# Patient Record
Sex: Male | Born: 1985 | Race: White | Hispanic: No | Smoking: Current every day smoker
Health system: Southern US, Community
[De-identification: ages and names within clinical notes are randomized; demographics above are authoritative.]

## PROBLEM LIST (undated history)

## (undated) DIAGNOSIS — F39 Unspecified mood [affective] disorder: Secondary | ICD-10-CM

## (undated) DIAGNOSIS — Z8679 Personal history of other diseases of the circulatory system: Secondary | ICD-10-CM

## (undated) DIAGNOSIS — F909 Attention-deficit hyperactivity disorder, unspecified type: Secondary | ICD-10-CM

## (undated) DIAGNOSIS — F1011 Alcohol abuse, in remission: Secondary | ICD-10-CM

## (undated) DIAGNOSIS — F419 Anxiety disorder, unspecified: Secondary | ICD-10-CM

## (undated) DIAGNOSIS — K219 Gastro-esophageal reflux disease without esophagitis: Secondary | ICD-10-CM

## (undated) DIAGNOSIS — F32A Depression, unspecified: Secondary | ICD-10-CM

## (undated) DIAGNOSIS — F1121 Opioid dependence, in remission: Secondary | ICD-10-CM

## (undated) DIAGNOSIS — M1990 Unspecified osteoarthritis, unspecified site: Secondary | ICD-10-CM

## (undated) DIAGNOSIS — R011 Cardiac murmur, unspecified: Secondary | ICD-10-CM

## (undated) DIAGNOSIS — F1220 Cannabis dependence, uncomplicated: Secondary | ICD-10-CM

## (undated) DIAGNOSIS — F439 Reaction to severe stress, unspecified: Secondary | ICD-10-CM

## (undated) HISTORY — DX: Attention-deficit hyperactivity disorder, unspecified type: F90.9

## (undated) HISTORY — PX: ADENOIDECTOMY: SUR15

## (undated) HISTORY — DX: Anxiety disorder, unspecified: F41.9

## (undated) HISTORY — PX: TONSILLECTOMY: SUR1361

## (undated) HISTORY — DX: Gastro-esophageal reflux disease without esophagitis: K21.9

## (undated) HISTORY — DX: Depression, unspecified: F32.A

## (undated) HISTORY — PX: HERNIA REPAIR: SHX51

---

## 2008-03-27 ENCOUNTER — Emergency Department: Payer: Self-pay | Admitting: Emergency Medicine

## 2008-05-28 ENCOUNTER — Emergency Department: Payer: Self-pay | Admitting: Emergency Medicine

## 2009-10-10 ENCOUNTER — Emergency Department: Payer: Self-pay | Admitting: Emergency Medicine

## 2010-05-28 ENCOUNTER — Emergency Department: Payer: Self-pay | Admitting: Emergency Medicine

## 2010-05-30 ENCOUNTER — Emergency Department: Payer: Self-pay | Admitting: Emergency Medicine

## 2010-06-03 ENCOUNTER — Emergency Department: Payer: Self-pay | Admitting: Unknown Physician Specialty

## 2010-10-01 ENCOUNTER — Emergency Department: Payer: Self-pay | Admitting: Emergency Medicine

## 2010-11-29 ENCOUNTER — Emergency Department: Payer: Self-pay | Admitting: Emergency Medicine

## 2011-01-04 ENCOUNTER — Emergency Department: Payer: Self-pay | Admitting: Emergency Medicine

## 2011-05-29 ENCOUNTER — Emergency Department: Payer: Self-pay | Admitting: *Deleted

## 2011-07-27 ENCOUNTER — Emergency Department: Payer: Self-pay | Admitting: Emergency Medicine

## 2011-07-27 LAB — URINALYSIS, COMPLETE
Blood: NEGATIVE
Leukocyte Esterase: NEGATIVE
Nitrite: NEGATIVE
Ph: 7 (ref 4.5–8.0)
RBC,UR: NONE SEEN /HPF (ref 0–5)
Specific Gravity: 1.005 (ref 1.003–1.030)
Squamous Epithelial: NONE SEEN
WBC UR: <1 /HPF (ref 0–5)

## 2012-06-12 ENCOUNTER — Emergency Department: Payer: Self-pay | Admitting: Emergency Medicine

## 2012-06-12 LAB — CBC
HGB: 16 g/dL (ref 13.0–18.0)
MCHC: 35.4 g/dL (ref 32.0–36.0)
MCV: 85 fL (ref 80–100)
Platelet: 236 10*3/uL (ref 150–440)
RBC: 5.3 10*6/uL (ref 4.40–5.90)
RDW: 13 % (ref 11.5–14.5)
WBC: 8.3 10*3/uL (ref 3.8–10.6)

## 2012-06-12 LAB — BASIC METABOLIC PANEL
Anion Gap: 6 — ABNORMAL LOW (ref 7–16)
BUN: 16 mg/dL (ref 7–18)
Creatinine: 0.69 mg/dL (ref 0.60–1.30)
Osmolality: 282 (ref 275–301)
Potassium: 3.6 mmol/L (ref 3.5–5.1)

## 2012-06-12 LAB — URINALYSIS, COMPLETE
Bacteria: NONE SEEN
Glucose,UR: NEGATIVE mg/dL (ref 0–75)
Ketone: NEGATIVE
Leukocyte Esterase: NEGATIVE
Nitrite: NEGATIVE
Ph: 5 (ref 4.5–8.0)
Protein: 30
RBC,UR: 1 /HPF (ref 0–5)
Specific Gravity: 1.024 (ref 1.003–1.030)
Squamous Epithelial: 1
WBC UR: 2 /HPF (ref 0–5)

## 2012-07-07 ENCOUNTER — Ambulatory Visit: Payer: Self-pay | Admitting: Orthopedic Surgery

## 2013-02-05 ENCOUNTER — Emergency Department: Payer: Self-pay | Admitting: Emergency Medicine

## 2013-03-03 ENCOUNTER — Emergency Department: Payer: Self-pay | Admitting: Emergency Medicine

## 2013-03-03 LAB — URINALYSIS, COMPLETE
Bilirubin,UR: NEGATIVE
Blood: NEGATIVE
Ketone: NEGATIVE
Leukocyte Esterase: NEGATIVE
Ph: 7 (ref 4.5–8.0)
Protein: NEGATIVE
Specific Gravity: 1.015 (ref 1.003–1.030)
Squamous Epithelial: NONE SEEN
WBC UR: <1 /HPF (ref 0–5)

## 2013-06-30 ENCOUNTER — Emergency Department: Payer: Self-pay | Admitting: Emergency Medicine

## 2013-09-27 ENCOUNTER — Emergency Department: Payer: Self-pay | Admitting: Emergency Medicine

## 2013-11-07 ENCOUNTER — Emergency Department: Payer: Self-pay | Admitting: Emergency Medicine

## 2013-11-10 ENCOUNTER — Emergency Department: Payer: Self-pay | Admitting: Emergency Medicine

## 2014-03-31 ENCOUNTER — Emergency Department: Payer: Self-pay | Admitting: Emergency Medicine
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
## 2014-07-29 ENCOUNTER — Emergency Department
Admission: EM | Admit: 2014-07-29 | Discharge: 2014-07-29 | Disposition: A | Discharge status: Home / Self Care | Payer: Self-pay | Attending: Emergency Medicine | Admitting: Emergency Medicine

## 2014-07-29 DIAGNOSIS — W260XXA Contact with knife, initial encounter: Secondary | ICD-10-CM | POA: Insufficient documentation

## 2014-07-29 DIAGNOSIS — S61317A Laceration without foreign body of left little finger with damage to nail, initial encounter: Secondary | ICD-10-CM | POA: Insufficient documentation

## 2014-07-29 DIAGNOSIS — S61215A Laceration without foreign body of left ring finger without damage to nail, initial encounter: Secondary | ICD-10-CM

## 2014-07-29 DIAGNOSIS — Y998 Other external cause status: Secondary | ICD-10-CM | POA: Insufficient documentation

## 2014-07-29 DIAGNOSIS — S61315A Laceration without foreign body of left ring finger with damage to nail, initial encounter: Secondary | ICD-10-CM | POA: Insufficient documentation

## 2014-07-29 DIAGNOSIS — S61209A Unspecified open wound of unspecified finger without damage to nail, initial encounter: Secondary | ICD-10-CM

## 2014-07-29 DIAGNOSIS — Z23 Encounter for immunization: Secondary | ICD-10-CM | POA: Insufficient documentation

## 2014-07-29 DIAGNOSIS — Y9389 Activity, other specified: Secondary | ICD-10-CM | POA: Insufficient documentation

## 2014-07-29 DIAGNOSIS — Y9289 Other specified places as the place of occurrence of the external cause: Secondary | ICD-10-CM | POA: Insufficient documentation

## 2014-07-29 MED ORDER — TETANUS-DIPHTH-ACELL PERTUSSIS 5-2.5-18.5 LF-MCG/0.5 IM SUSP
0.5000 mL | Freq: Once | INTRAMUSCULAR | Status: AC
  Administered 2014-07-29: 0.5 mL via INTRAMUSCULAR

## 2014-07-29 MED ORDER — TETANUS-DIPHTH-ACELL PERTUSSIS 5-2.5-18.5 LF-MCG/0.5 IM SUSP
INTRAMUSCULAR | Status: AC
  Filled 2014-07-29: qty 0.5

## 2014-07-29 MED ORDER — TETANUS-DIPHTHERIA TOXOIDS TD 5-2 LFU IM INJ
INJECTION | INTRAMUSCULAR | Status: AC
  Filled 2014-07-29: qty 0.5

## 2014-07-29 NOTE — ED Notes (Signed)
Pt was cutting a zip tie with a knife and cut left 4th and 5th fingers.

## 2014-07-29 NOTE — Discharge Instructions (Signed)
Keep clean. Clean daily with soap and water, rinse, pat dry then apply thin layer topical antibiotic ointment daily.   Follow up with your primary care physician or the above next week.   Return to ER for new or worsening concerns.   Fingertip Injuries and Amputations Fingertip injuries are common and often get injured because they are last to escape when pulling your hand out of harm's way. You have amputated (cut off) part of your finger. How this turns out depends largely on how much was amputated. If just the tip is amputated, often the end of the finger will grow back and the finger may return to much the same as it was before the injury.  If more of the finger is missing, your caregiver has done the best with the tissue remaining to allow you to keep as much finger as is possible. Your caregiver after checking your injury has tried to leave you with a painless fingertip that has durable, feeling skin. If possible, your caregiver has tried to maintain the finger's length and appearance and preserve its fingernail.  Please read the instructions outlined below and refer to this sheet in the next few weeks. These instructions provide you with general information on caring for yourself. Your caregiver may also give you specific instructions. While your treatment has been done according to the most current medical practices available, unavoidable complications occasionally occur. If you have any problems or questions after discharge, please call your caregiver. HOME CARE INSTRUCTIONS   You may resume normal diet and activities as directed or allowed.  Keep your hand elevated above the level of your heart. This helps decrease pain and swelling.  Keep ice packs (or a bag of ice wrapped in a towel) on the injured area for 15-20 minutes, 03-04 times per day, for the first two days.  Change dressings if necessary or as directed.  Clean the wound daily or as directed.  Only take over-the-counter or  prescription medicines for pain, discomfort, or fever as directed by your caregiver.  Keep appointments as directed. SEEK IMMEDIATE MEDICAL CARE IF:  You develop redness, swelling, numbness or increasing pain in the wound.  There is pus coming from the wound.  You develop an unexplained oral temperature above 102 F (38.9 C) or as your caregiver suggests.  There is a foul (bad) smell coming from the wound or dressing.  There is a breaking open of the wound (edges not staying together) after sutures or staples have been removed. MAKE SURE YOU:   Understand these instructions.  Will watch your condition.  Will get help right away if you are not doing well or get worse. Document Released: 02/01/2005 Document Revised: 06/05/2011 Document Reviewed: 01/01/2008 [facility]reExit[facility] Patient Information 2015 [place], [place]. This information is not intended to replace advice given to you by your health care provider. Make sure you discuss any questions you have with your health care provider.  Laceration Care, Adult A laceration is a cut that goes through all layers of the skin. The cut goes into the tissue beneath the skin. HOME CARE For stitches (sutures) or staples:  Keep the cut clean and dry.  If you have a bandage (dressing), change it at least once a day. Change the bandage if it gets wet or dirty, or as told by your doctor.  Wash the cut with soap and water 2 times a day. Rinse the cut with water. Pat it dry with a clean towel.  Put a thin layer  of medicated cream on the cut as told by your doctor.  You may shower after the first 24 hours. Do not soak the cut in water until the stitches are removed.  Only take medicines as told by your doctor.  Have your stitches or staples removed as told by your doctor. For skin adhesive strips:  Keep the cut clean and dry.  Do not get the strips wet. You may take a bath, but be careful to keep the cut dry.  If the cut gets wet, pat it dry with  a clean towel.  The strips will fall off on their own. Do not remove the strips that are still stuck to the cut. For wound glue:  You may shower or take baths. Do not soak or scrub the cut. Do not swim. Avoid heavy sweating until the glue falls off on its own. After a shower or bath, pat the cut dry with a clean towel.  Do not put medicine on your cut until the glue falls off.  If you have a bandage, do not put tape over the glue.  Avoid lots of sunlight or tanning lamps until the glue falls off. Put sunscreen on the cut for the first year to reduce your scar.  The glue will fall off on its own. Do not pick at the glue. You may need a tetanus shot if:  You cannot remember when you had your last tetanus shot.  You have never had a tetanus shot. If you need a tetanus shot and you choose not to have one, you may get tetanus. Sickness from tetanus can be serious. GET HELP RIGHT AWAY IF:   Your pain does not get better with medicine.  Your arm, hand, leg, or foot loses feeling (numbness) or changes color.  Your cut is bleeding.  Your joint feels weak, or you cannot use your joint.  You have painful lumps on your body.  Your cut is red, puffy (swollen), or painful.  You have a red line on the skin near the cut.  You have yellowish-white fluid (pus) coming from the cut.  You have a fever.  You have a bad smell coming from the cut or bandage.  Your cut breaks open before or after stitches are removed.  You notice something coming out of the cut, such as wood or glass.  You cannot move a finger or toe. MAKE SURE YOU:   Understand these instructions.  Will watch your condition.  Will get help right away if you are not doing well or get worse. Document Released: 08/30/2007 Document Revised: 06/05/2011 Document Reviewed: 09/06/2010 [facility] CenterExit[facility] Patient Information 2015 [place], [place]. This information is not intended to replace advice given to you by your health care provider.  Make sure you discuss any questions you have with your health care provider.

## 2014-07-29 NOTE — ED Provider Notes (Signed)
[facility] Emergency Department Provider Note    ____________________________________________  Time seen: ----------------------------------------- 10:00 PM on 07/29/2014 -----------------------------------------   I have reviewed the triage vital signs and the nursing notes.   HISTORY  Chief Complaint Laceration    HPI Shawn Murray is a 29 y.o. male presents to the ER with mother due to laceration to the left fourth and fifth fingers. Reports he was trying to cut a zip tie off of a lawnmower with a knife and knife slipped and cut fourth and fifth fingers. Denies other injury, fall or crush injury. States minimal pain. Denies pain with range of motion. Describes pain as mildly tender.   No past medical history on file.  There are no active problems to display for this patient.   No past surgical history on file.  No current outpatient prescriptions on file.  Allergies Sulfa antibiotics and Aspirin  No family history on file.  Social History History  Substance Use Topics  . Smoking status: Not on file  . Smokeless tobacco: Not on file  . Alcohol Use: Not on file    Review of Systems  Constitutional: Negative for fever. Eyes: Negative for visual changes. ENT: Negative for sore throat. Cardiovascular: Negative for chest pain. Respiratory: Negative for shortness of breath. Gastrointestinal: Negative for abdominal pain, vomiting and diarrhea. Genitourinary: Negative for dysuria. Musculoskeletal: Negative for back pain. Skin: left 4/5 finger laceration as above. [facility]  Neurological: Negative for headaches, focal weakness or numbness.   10-point ROS otherwise negative.  ____________________________________________   PHYSICAL EXAM:  VITAL SIGNS: ED Triage Vitals  Enc Vitals Group     BP 07/29/14 1954 129/74 mmHg     Pulse Rate 07/29/14 1954 84     Resp -- 18     Temp 07/29/14 1954 98.2 F  (36.8 C)     Temp src --      SpO2 07/29/14 1954 98 %     Weight 07/29/14 1954 160 lb (72.576 kg)     Height 07/29/14 1954 6' (1.829 m)     Head Cir --      Peak Flow --      Pain Score 07/29/14 1955 7     Pain Loc --      Pain Edu? --      Excl. in GC? --      Constitutional: Alert and oriented. Well appearing and in no distress. ENT   Head: Normocephalic and atraumatic.   Nose: No congestion/rhinnorhea. Cardiovascular: Normal rate, regular rhythm. Normal and symmetric distal pulses are present in all extremities. No murmurs, rubs, or gallops. Respiratory: Normal respiratory effort without tachypnea nor retractions. Breath sounds are clear and equal bilaterally. No wheezes/rales/rhonchi. Gastrointestinal: Soft and nontender. No distention.  Genitourinary: deferred Musculoskeletal: Nontender with normal range of motion in all extremities. No joint effusions.  No lower extremity tenderness nor edema. Neurologic:  Normal speech and language. No gross focal neurologic deficits are appreciated. Speech is normal. No gait instability. Skin:   Left fourth finger distal phalanx with superficial laceration <0.5cm. Full ROM. Neuro/vasc intact. No erythema. No active bleeding. No pain with ROM.   Left fifth distal finger tip with <0.5 cm superficial laceration. Full ROM. Neuro/vasc intact. No erythema. No active bleeding. No pain with ROM.  Psychiatric: Mood and affect are normal. Speech and behavior are normal. Patient exhibits appropriate insight and judgment.  ________________________________________   PROCEDURES  LACERATION REPAIR Performed by: Renford DillsLindsey Mikeala Girdler  Authorized by: Renford DillsLindsey Elzena Muston Consent: Verbal consent obtained. Risks and benefits: risks, benefits and alternatives were discussed Consent given by: patient Patient identity confirmed: provided demographic data Prepped and Draped in normal sterile fashion Wound explored  Laceration Location: left 4th  finger  Laceration Length: <0.5cm  No Foreign Bodies seen or palpated  Anesthesia:none  Irrigation method: syringe Amount of cleaning: standard. Cleaned with saline and betadine.  Dressing applied.  Patient tolerance: Patient tolerated the procedure well with no immediate complications. LACERATION REPAIR Performed by: Renford DillsLindsey Bentley Fissel Authorized by: Renford DillsLindsey Trey Gulbranson Consent: Verbal consent obtained. Risks and benefits: risks, benefits and alternatives were discussed Consent given by: patient Patient identity confirmed: provided demographic data Prepped and Draped in normal sterile fashion Wound explored  Laceration Location: left 5th finger  Laceration Length: <0.5cm  No Foreign Bodies seen or palpated  Anesthesia:none  Irrigation method: syringe Amount of cleaning: standard. Cleaned with saline and betadine.  Distal finger tip avulsion. Wound edge revised.   No repair indicated.   Sterile dressing applied.  Patient tolerance: Patient tolerated the procedure well with no immediate complications. ____________________________________________   INITIAL IMPRESSION / ASSESSMENT AND PLAN / ED COURSE  Pertinent labs & imaging results that were available during my care of the patient were reviewed by me and considered in my medical decision making (see chart for details).  Well appearing. Presents post obtaining superficial lacerations to left 4 and 5 fingers. Wounds clean. No repair indicated. Fifth finger with superficial distal finger tip avulsion. Discussed cleaning and wound care. Follow up with PCP. Discussed return parameters.  ____________________________________________   FINAL CLINICAL IMPRESSION(S) / ED DIAGNOSES  Final diagnoses:  Laceration of fourth finger of left hand, initial encounter  Avulsion, finger tip, initial encounter to left fifth finger    Renford DillsLindsey Shenita Trego, NP 07/29/14 [id number]  Phineas SemenGraydon Goodman, MD 07/29/14 [phone]

## 2014-08-19 IMAGING — CR DG ANKLE COMPLETE 3+V*L*
1 series · 3 of 3 positions shown · non-contrast
Comparison: None.

CLINICAL DATA: 28-year-old male status post fall with pain and
swelling. Initial encounter.

EXAM:
LEFT ANKLE COMPLETE - 3+ VIEW

[Series 1: ap · 0.17mm/px · 3 of 3 slices shown]
[im 1/3]
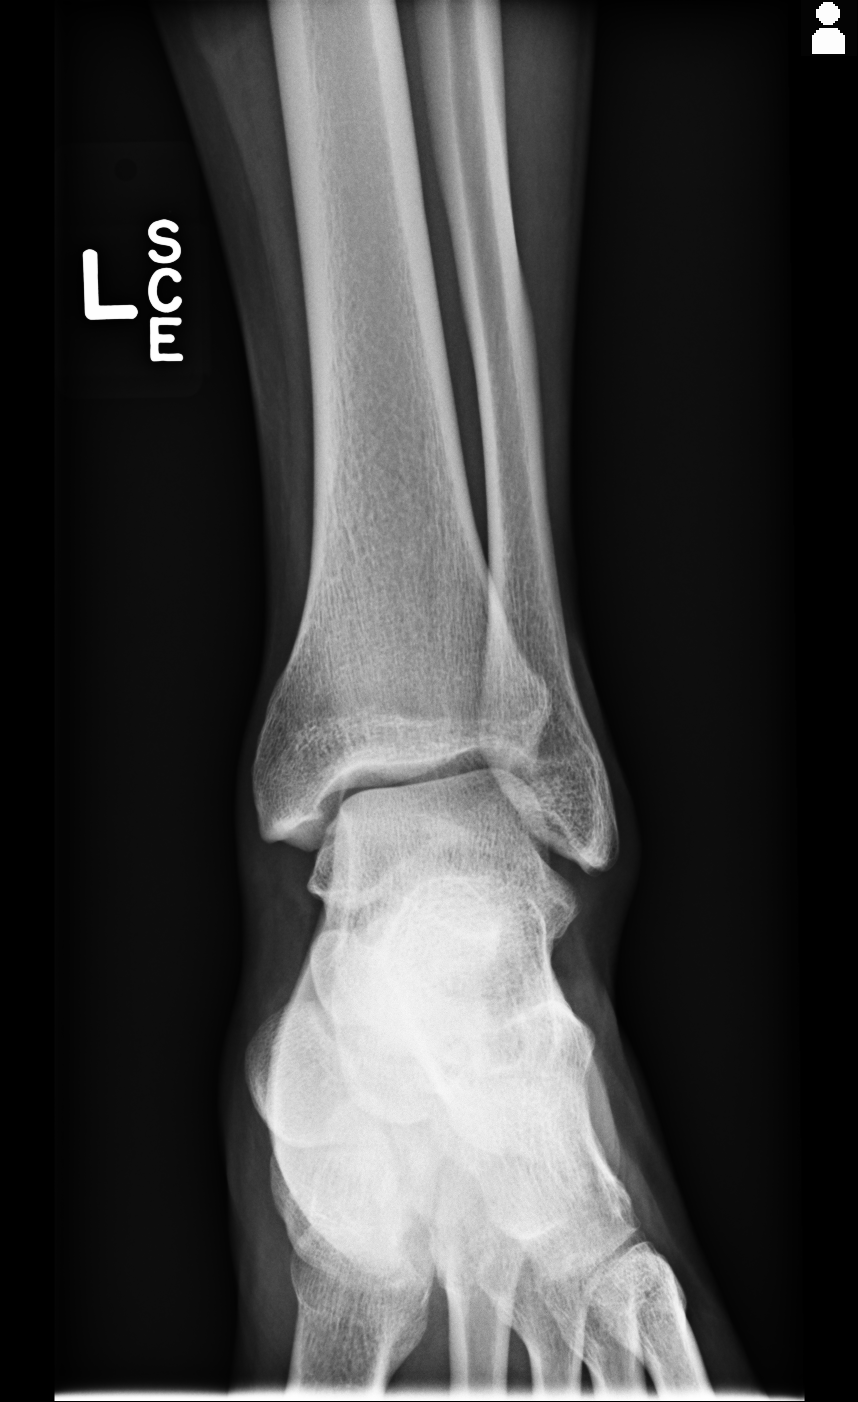
[im 2/3]
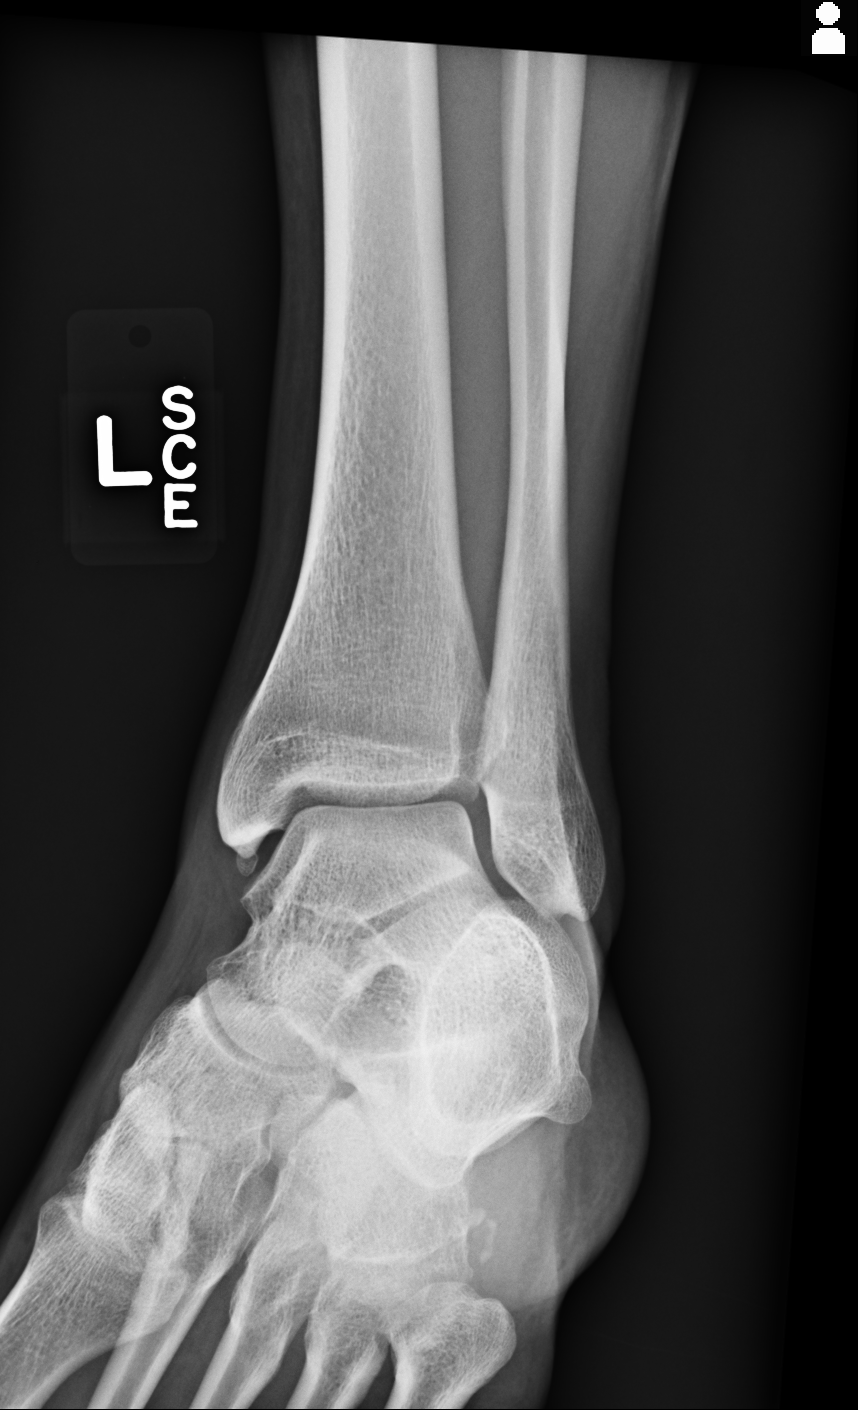
[im 3/3]
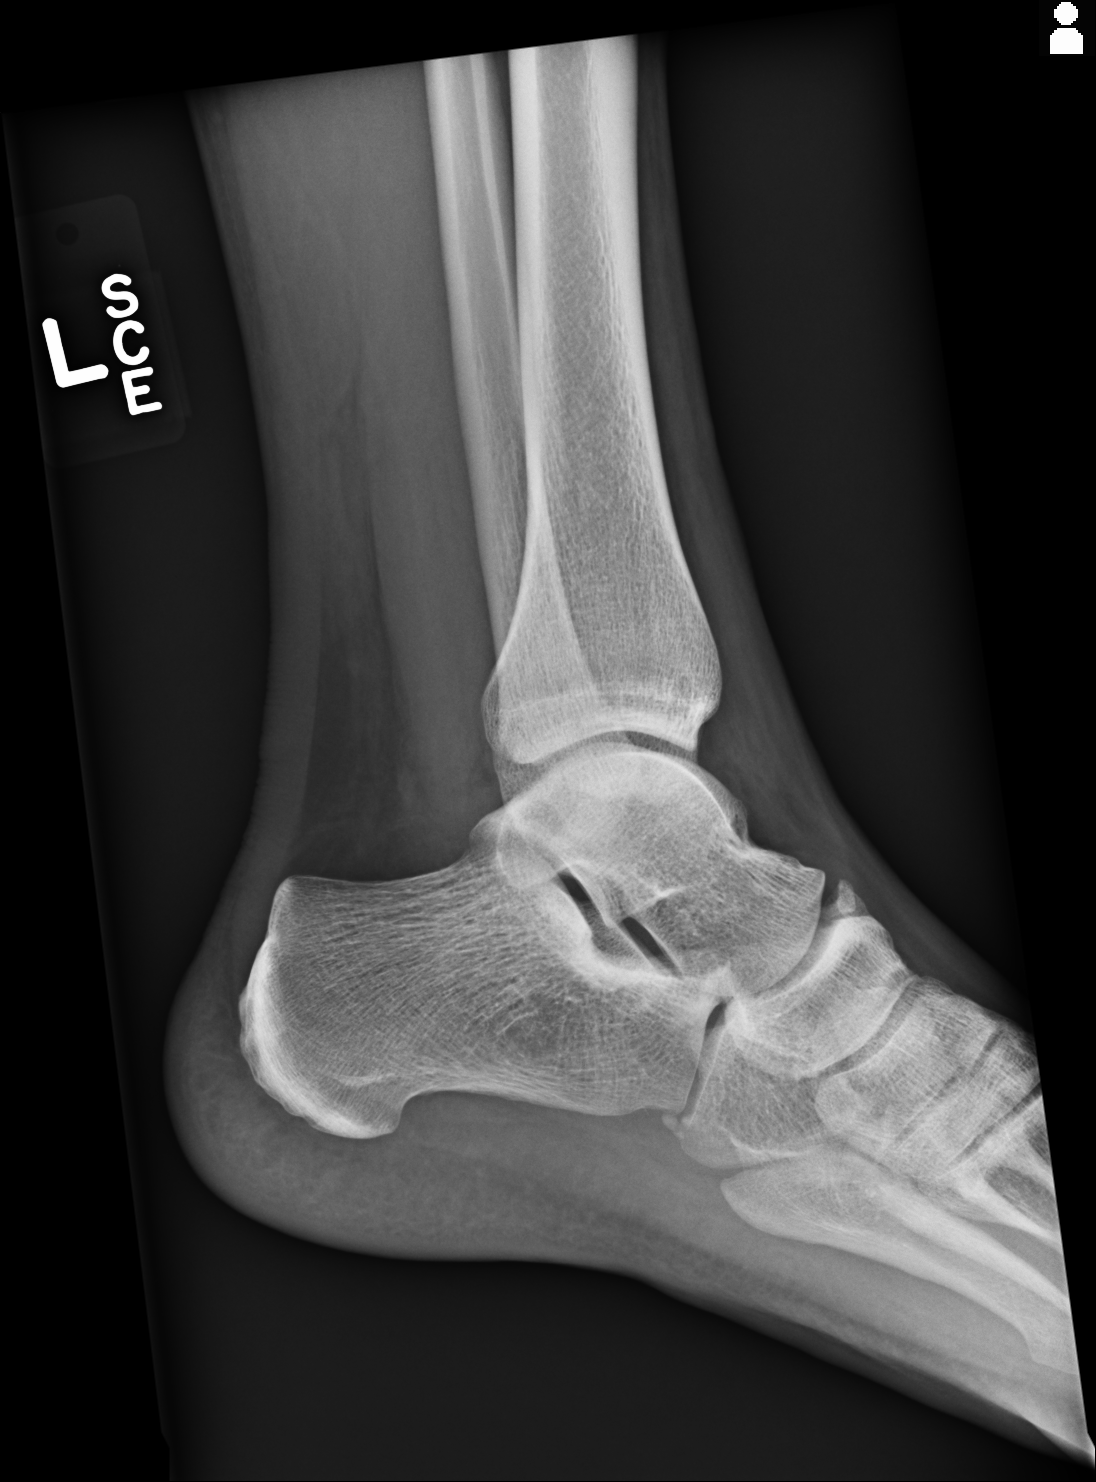

[3 of 3 positions shown; findings below may reference images not displayed]

FINDINGS: Anterior soft tissue stranding at the ankle with no definite joint
effusion identified. Mortise joint alignment preserved. Talar dome
intact. Calcaneus intact. Small chronic appearing bone fragment
adjacent to the medial malleolus. Also small chronic appearing
fragments seen adjacent to the cuboid. No acute fracture or
dislocation identified.
IMPRESSION: No acute fracture or dislocation identified about the left ankle.

## 2015-10-04 ENCOUNTER — Encounter: Payer: Self-pay | Admitting: *Deleted

## 2015-10-04 ENCOUNTER — Emergency Department
Admission: EM | Admit: 2015-10-04 | Discharge: 2015-10-04 | Disposition: A | Discharge status: Home / Self Care | Payer: Self-pay | Attending: Emergency Medicine | Admitting: Emergency Medicine

## 2015-10-04 DIAGNOSIS — Z87891 Personal history of nicotine dependence: Secondary | ICD-10-CM | POA: Insufficient documentation

## 2015-10-04 DIAGNOSIS — B349 Viral infection, unspecified: Secondary | ICD-10-CM | POA: Insufficient documentation

## 2015-10-04 HISTORY — DX: Cardiac murmur, unspecified: R01.1

## 2015-10-04 LAB — CBC
HCT: 42.8 % (ref 40.0–52.0)
Hemoglobin: 14.9 g/dL (ref 13.0–18.0)
MCH: 30.2 pg (ref 26.0–34.0)
MCHC: 34.8 g/dL (ref 32.0–36.0)
MCV: 86.8 fL (ref 80.0–100.0)
PLATELETS: 187 10*3/uL (ref 150–440)
RBC: 4.94 MIL/uL (ref 4.40–5.90)
RDW: 12.9 % (ref 11.5–14.5)
WBC: 7.5 10*3/uL (ref 3.8–10.6)

## 2015-10-04 LAB — TROPONIN I: Troponin I: <0.03 ng/mL (ref <0.03)

## 2015-10-04 LAB — COMPREHENSIVE METABOLIC PANEL
ALBUMIN: 4.6 g/dL (ref 3.5–5.0)
ALT: 18 U/L (ref 17–63)
ANION GAP: 5 (ref 5–15)
AST: 7 U/L — ABNORMAL LOW (ref 15–41)
Alkaline Phosphatase: 51 U/L (ref 38–126)
BUN: 8 mg/dL (ref 6–20)
CHLORIDE: 111 mmol/L (ref 101–111)
CO2: 25 mmol/L (ref 22–32)
CREATININE: 0.77 mg/dL (ref 0.61–1.24)
Calcium: 9 mg/dL (ref 8.9–10.3)
GFR calc non Af Amer: >60 mL/min (ref >60)
GLUCOSE: 107 mg/dL — AB (ref 65–99)
Potassium: 3.6 mmol/L (ref 3.5–5.1)
SODIUM: 141 mmol/L (ref 135–145)
Total Bilirubin: 0.5 mg/dL (ref 0.3–1.2)
Total Protein: 6.9 g/dL (ref 6.5–8.1)

## 2015-10-04 MED ORDER — ONDANSETRON HCL 4 MG PO TABS: 4.0000 mg | ORAL_TABLET | Freq: Every day | ORAL | Status: DC | PRN

## 2015-10-04 MED ORDER — ONDANSETRON HCL 4 MG/2ML IJ SOLN
4.0000 mg | Freq: Once | INTRAMUSCULAR | Status: AC
  Administered 2015-10-04: 4 mg via INTRAVENOUS
  Filled 2015-10-04: qty 2

## 2015-10-04 MED ORDER — SODIUM CHLORIDE 0.9 % IV SOLN
1000.0000 mL | Freq: Once | INTRAVENOUS | Status: AC
  Administered 2015-10-04: 1000 mL via INTRAVENOUS

## 2015-10-04 NOTE — ED Notes (Signed)
States his whole body is numb, states cold sweats and he feels dehydrated, states he feels as if he is going to pass out, states nausea, symptoms present since Friday, pt awake and alert in no distress, states he has a black mark on his lip that he noticed after he stopped smoking a year ago, states lip numbness

## 2015-10-04 NOTE — Discharge Instructions (Signed)
Viral Infections °A viral infection can be caused by different types of viruses. Most viral infections are not serious and resolve on their own. However, some infections may cause severe symptoms and may lead to further complications. °SYMPTOMS °Viruses can frequently cause: °· Minor sore throat. °· Aches and pains. °· Headaches. °· Runny nose. °· Different types of rashes. °· Watery eyes. °· Tiredness. °· Cough. °· Loss of appetite. °· Gastrointestinal infections, resulting in nausea, vomiting, and diarrhea. °These symptoms do not respond to antibiotics because the infection is not caused by bacteria. However, you might catch a bacterial infection following the viral infection. This is sometimes called a "superinfection." Symptoms of such a bacterial infection may include: °1. Worsening sore throat with pus and difficulty swallowing. °2. Swollen neck glands. °3. Chills and a high or persistent fever. °4. Severe headache. °5. Tenderness over the sinuses. °6. Persistent overall ill feeling (malaise), muscle aches, and tiredness (fatigue). °7. Persistent cough. °8. Yellow, green, or brown mucus production with coughing. °HOME CARE INSTRUCTIONS  °· Only take over-the-counter or prescription medicines for pain, discomfort, diarrhea, or fever as directed by your caregiver. °· Drink enough water and fluids to keep your urine clear or pale yellow. Sports drinks can provide valuable electrolytes, sugars, and hydration. °· Get plenty of rest and maintain proper nutrition. Soups and broths with crackers or rice are fine. °SEEK IMMEDIATE MEDICAL CARE IF:  °· You have severe headaches, shortness of breath, chest pain, neck pain, or an unusual rash. °· You have uncontrolled vomiting, diarrhea, or you are unable to keep down fluids. °· You or your child has an oral temperature above 102° F (38.9° C), not controlled by medicine. °· Your baby is older than 3 months with a rectal temperature of 102° F (38.9° C) or higher. °· Your  baby is 3 months old or younger with a rectal temperature of 100.4° F (38° C) or higher. °MAKE SURE YOU:  °· Understand these instructions. °· Will watch your condition. °· Will get help right away if you are not doing well or get worse. °  °This information is not intended to replace advice given to you by your health care provider. Make sure you discuss any questions you have with your health care provider. °  °Document Released: 12/21/2004 Document Revised: 06/05/2011 Document Reviewed: 08/19/2014 °Elsevier Interactive Patient Education ©2016 Elsevier Inc. ° °Infection Control in the Home °If you have an infection or you are taking care of someone who has an infection, it is important to know how to keep the infection from spreading. Follow these guidelines to help stop the spread of infection, and talk to your health care provider. °HOW ARE INFECTIONS SPREAD? °In order for an infection to spread, the following must be present: °· A germ. This may be a virus, bacteria, fungus, or parasite. °· A place for the germ to live. This may be: °¨ On or in a person, animal, plant, or food. °¨ In soil or water. °¨ On surfaces, such as a door handle. °· A susceptible host. This is a person or animal who does not have resistance (immunity) to the germ. °· A way for the germ to enter the host. This may occur by: °¨ Direct contact. This may happen by making contact--such as shaking hands or hugging--with an infected person or animal. Some germs can also travel through the air and spread to you if an infected person coughs or sneezes on you or near you. °¨ Indirect contact. This is when   the germ enters the host through contact with an infected object. Examples include eating contaminated food, drinking contaminated water, or touching a contaminated surface with your hands and then touching your face, nose, or mouth soon after that. HOW CAN I HELP TO PREVENT INFECTION FROM SPREADING? There are several things that you can do to  help prevent infection from spreading. Hand Washing It is very important to wash your hands correctly, following these steps: 9. Wet your hands with clean, running water. 10. Apply soap to your hands. Liquid soap is better than bar soap. 11. Rub your hands together quickly to create lather. 12. Keep rubbing your hands together for at least 20 seconds. Thoroughly scrub all parts of your hands, including under your fingernails and between your fingers. 13. Rinse your hands with clean, running water until all of the soap is gone. 14. Dry your hands with an air dryer or a clean paper or cloth towel, or let your hands air-dry. Do not use your clothing or a soiled towel to dry your hands. 15. If you are in a public restroom, use your towel to turn off the water faucet and to open the bathroom door. Make sure to wash your hands:  Before:  Visiting a baby or anyone with a weakened or lowered defense (immune) system.  Putting in and taking out any contact lenses.  After:  Working or playing outside.  Touching an animal or its toys or leash.  Handling livestock.  Using the bathroom or helping a child or adult to use the bathroom.  Using household cleaners or toxic chemicals.  Touching or taking out the garbage.  Touching anything dirty around your home.  Handling soiled clothes or rags.  Taking care of a sick child. This includes touching used tissues, toys, and clothes.  Sneezing, coughing, or blowing your nose.  Using public transportation.  Shaking hands.  Using a phone, including your mobile phone.  Touching money.  Before and after:  Preparing food.  Preparing a bottle for a baby.  Feeding a baby or a young child.  Eating.  Visiting or taking care of someone who is sick.  Changing a diaper.  Changing a bandage (dressing) or taking care of an injury or wound.  Giving or taking medicine. Taking Care of Your Home  Make sure that you have enough cleaning  supplies at all times. These include:  Disinfectants.  Reusable cleaning cloths. Wash these after each use.  Paper towels.  Utility gloves. Replace your gloves if they are cracked or torn or if they start to peel.  Use bleach safely. Never mix it with other cleaning products, especially those that contain ammonia. This mixture can create a dangerous gas that may be deadly.  Take care of your cleaning supplies. Toilet brushes, mops, and sponges can breed germs. Soak them in bleach and water for 5 minutes after each use.  Do not pour used mop water down the sink. Pour it down the toilet instead.  Maintain proper ventilation in your home.  If you have a pet, ensure that your pet stays clean. Do not let people with weak immune systems touch bird droppings, fish tank water, or a litter box.  If you have a cat, be sure to change the litter every day.  In the bathroom, make sure you:  Provide liquid soap.  Change towels and washcloths frequently. Avoid sharing towels and washcloths.  Change toothbrushes often and store them separately in a clean, dry place.  Disinfect  the toilet.  Clean the tub, shower, and sink with standard cleaning products.   Mop the floor with a standard cleaner.  Do not share personal items, such as razors, toothbrushes, drinking glasses, deodorant, combs, brushes, towels, and washcloths.   In the kitchen, make sure you:  Store food carefully.  Refrigerate leftovers promptly in covered containers.  Throw out stale or spoiled food.  Clean the inside of your refrigerator each week.  Keep your refrigerator set at 36F (4C) or less, and set your freezer at 56F (-18C) or less.  Thaw foods in the refrigerator or microwave, not at room temperature.  Serve foods at the proper temperature. Do not eat raw meat. Make sure it is cooked to the appropriate temperature.Cook eggs until they are firm.  Wash fruits and vegetables under running water.  Use  separate cutting boards, plates, and utensils for raw foods and cooked foods.  Keep work surfaces clean.  Use a clean spoon each time you sample food while cooking.  Wash your dishes in hot, soapy water. Air-dry your dishes or use a dishwasher.  Do not share forks, cups, or spoons during meals.  Wear gloves if laundry is visibly soiled.  Change linens each week or whenever they are soiled.  Do not shake soiled linens. Doing that may send germs into the air. Put dressings, sanitary or incontinence pads, diapers, and gloves in plastic garbage bags for disposal.   This information is not intended to replace advice given to you by your health care provider. Make sure you discuss any questions you have with your health care provider.   Document Released: 12/21/2007 Document Revised: 04/03/2014 Document Reviewed: 11/13/2013 Elsevier Interactive Patient Education [employer]2016 Elsevier Inc.

## 2015-10-04 NOTE — ED Provider Notes (Signed)
[facility] Emergency Department Provider Note   ____________________________________________    I have reviewed the triage vital signs and the nursing notes.   HISTORY  Chief Complaint Nausea, congestion, chills    HPI Shawn Murray is a 30 y.o. male who presents with multiple symptoms. He complains of mild nausea, nasal congestion, chills. He reports his girlfriend has upper respiratory infection. He denies abdominal pain. He has had mild diarrhea. He also reports 2 episodes of dizziness over the last 3 days. Currently he feels relatively well. He denies chest pain or palpitations. He also states that his fingers have become numb in both hands once over the last couple of days   Past Medical History  Diagnosis Date  . Heart murmur     There are no active problems to display for this patient.   History reviewed. No pertinent past surgical history.  No current outpatient prescriptions on file.  Allergies Sulfa antibiotics and Aspirin  History reviewed. No pertinent family history.  Social History Social History  Substance Use Topics  . Smoking status: Former Games developer  . Smokeless tobacco: None  . Alcohol Use: None    Review of Systems  Constitutional:Positive chills Eyes: No visual changes. No discharge ENT: No sore throat. Cardiovascular: Denies chest pain. Respiratory: Denies shortness of breath.Mild cough Gastrointestinal: No abdominal pain.    Genitourinary: Negative for dysuria. Musculoskeletal: Negative for back pain. Skin: Negative for rash. Neurological: Negative for headaches   10-point ROS otherwise negative.  ____________________________________________   PHYSICAL EXAM:  VITAL SIGNS: ED Triage Vitals  Enc Vitals Group     BP 10/04/15 1016 137/80 mmHg     Pulse Rate 10/04/15 1016 59     Resp 10/04/15 1016 18     Temp 10/04/15 1016 97.7 F (36.5 C)     Temp Source 10/04/15 1016 Oral     SpO2 10/04/15 1016 97  %     Weight 10/04/15 1016 160 lb (72.576 kg)     Height 10/04/15 1016  (1.753 m)     Head Cir --      Peak Flow --      Pain Score --      Pain Loc --      Pain Edu? --      Excl. in GC? --     Constitutional: Alert and oriented. No acute distress. Pleasant and interactive Eyes: Conjunctivae are normal.  Head: Atraumatic.Normocephalic Nose: Positive congestion Mouth/Throat: Mucous membranes are moist.  Oropharynx non-erythematous. Neck: No stridor. Painless ROM Cardiovascular: Normal rate, regular rhythm. Grossly normal heart sounds.  Good peripheral circulation. Respiratory: Normal respiratory effort.  No retractions. Lungs CTAB. Gastrointestinal: Soft and nontender. No distention.  No CVA tenderness. Genitourinary: deferred Musculoskeletal: No lower extremity tenderness nor edema.  Warm and well perfused Neurologic:  Normal speech and language. No gross focal neurologic deficits are appreciated.  Skin:  Skin is warm, dry and intact. No rash noted. Psychiatric: Mood and affect are normal. Speech and behavior are normal.  ____________________________________________   LABS (all labs ordered are listed, but only abnormal results are displayed)  Labs Reviewed  COMPREHENSIVE METABOLIC PANEL - Abnormal; Notable for the following:    Glucose, Bld 107 (*)    AST 7 (*)    All other components within normal limits  CBC   ____________________________________________  EKG  ED ECG REPORT I, Jene Every, the attending physician, personally viewed and interpreted this ECG.  Date: 10/04/2015 EKG Time: 10:32  AM Rate: 51 Rhythm: Sinus bradycardia QRS Axis: normal Intervals: normal ST/T Wave abnormalities: normal Conduction Disturbances: none Narrative Interpretation: unremarkable  ____________________________________________  RADIOLOGY  None ____________________________________________   PROCEDURES  Procedure(s) performed: No    Critical Care performed:  No ____________________________________________   INITIAL IMPRESSION / ASSESSMENT AND PLAN / ED COURSE  Pertinent labs & imaging results that were available during my care of the patient were reviewed by me and considered in my medical decision making (see chart for details).  Patient well-appearing and in no distress. His exam is benign. Lab work is unremarkable. Vitals are unremarkable. He is well-appearing and nontoxic. I suspect a viral illness causing his symptoms. Recommend supportive care. Return precautions discussed. ____________________________________________   FINAL CLINICAL IMPRESSION(S) / ED DIAGNOSES  Final diagnoses:  Viral illness      NEW MEDICATIONS STARTED DURING THIS VISIT:  New Prescriptions   No medications on file     Note:  This document was prepared using Dragon voice recognition software and may include unintentional dictation errors.    Jene Everyobert Stanly Si, MD 10/04/15 1534

## 2016-05-18 ENCOUNTER — Emergency Department
Admission: EM | Admit: 2016-05-18 | Discharge: 2016-05-18 | Disposition: A | Discharge status: Home / Self Care | Payer: Worker's Compensation | Attending: Emergency Medicine | Admitting: Emergency Medicine

## 2016-05-18 DIAGNOSIS — Z87891 Personal history of nicotine dependence: Secondary | ICD-10-CM | POA: Insufficient documentation

## 2016-05-18 DIAGNOSIS — T6594XA Toxic effect of unspecified substance, undetermined, initial encounter: Secondary | ICD-10-CM | POA: Insufficient documentation

## 2016-05-18 DIAGNOSIS — H5713 Ocular pain, bilateral: Secondary | ICD-10-CM | POA: Diagnosis present

## 2016-05-18 DIAGNOSIS — H10213 Acute toxic conjunctivitis, bilateral: Secondary | ICD-10-CM | POA: Diagnosis not present

## 2016-05-18 MED ORDER — TETRACAINE HCL 0.5 % OP SOLN
OPHTHALMIC | Status: AC
  Filled 2016-05-18: qty 2

## 2016-05-18 MED ORDER — TOBRAMYCIN 0.3 % OP SOLN: 2.0000 [drp] | OPHTHALMIC | 0 refills | Status: DC

## 2016-05-18 MED ORDER — EYE WASH OPHTH SOLN
OPHTHALMIC | Status: AC
  Filled 2016-05-18: qty 118

## 2016-05-18 MED ORDER — FLUORESCEIN SODIUM 1 MG OP STRP
ORAL_STRIP | OPHTHALMIC | Status: AC
  Filled 2016-05-18: qty 1

## 2016-05-18 NOTE — ED Notes (Signed)
Pt states was at work when clear oil got onto his hands, pt then states he proceeded to rub his eyes with the oil on his hands. C/o burning pain to L eye at this time, denies any loss of vision at this time.

## 2016-05-18 NOTE — ED Triage Notes (Signed)
Pt arrives here from his job with reports of getting oil into his eye   6/10 pain to the site reported

## 2016-05-18 NOTE — ED Notes (Signed)
NAD noted at time of D/C. Pt denies questions or concerns. Pt ambulatory to the lobby at this time.  

## 2016-05-18 NOTE — Discharge Instructions (Signed)
Follow-up with [facility] if any continued problems. Begin using eyedrops as directed. 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

## 2016-05-18 NOTE — ED Provider Notes (Signed)
[facility] Emergency Department Provider Note  ____________________________________________   First MD Initiated Contact with Patient 05/18/16 [phone]     (approximate)  I have reviewed the triage vital signs and the nursing notes.   HISTORY  Chief Complaint Eye Injury    HPI Shawn Murray is a 31 y.o. male is here with complaint of bilateral eye pain. Patient states the left eye is burning worse than the right. Patient states that while at work he got some clear oral into his eyes from getting it on his hands. He states that he irrigated his eyes with water almost immediately. This happened approximately 2 hours prior to his arrival in the emergency room. He denies any vision changes other than it is burning. He has not used any other medication to his eyes other than water. Patient voices that he is not happy being in the emergency room but his "job make me come". Currently he rates his pain as a 6/10.   Past Medical History:  Diagnosis Date  . Heart murmur     There are no active problems to display for this patient.   No past surgical history on file.  Prior to Admission medications   Medication Sig Start Date End Date Taking? Authorizing Provider  ondansetron (ZOFRAN) 4 MG tablet Take 1 tablet (4 mg total) by mouth daily as needed for nausea or vomiting. 10/04/15   Jene Every, MD  tobramycin (TOBREX) 0.3 % ophthalmic solution Place 2 drops into both eyes every 4 (four) hours. 05/18/16   Tommi Rumps, PA-C    Allergies Sulfa antibiotics and Aspirin  No family history on file.  Social History Social History  Substance Use Topics  . Smoking status: Former Games developer  . Smokeless tobacco: Never Used  . Alcohol use Not on file    Review of Systems Constitutional: No fever/chills Eyes: No visual changes.Bilateral eye burning. ENT: No sore throat. Cardiovascular: Denies chest pain. Respiratory: Denies shortness of  breath. Gastrointestinal:   No nausea, no vomiting.   Skin: Negative for rash. Neurological: Negative for headaches  10-point ROS otherwise negative.  ____________________________________________   PHYSICAL EXAM:  VITAL SIGNS: ED Triage Vitals  Enc Vitals Group     BP 05/18/16 0855 129/68     Pulse Rate 05/18/16 0855 66     Resp 05/18/16 0855 18     Temp 05/18/16 0855 97.8 F (36.6 C)     Temp Source 05/18/16 0855 Oral     SpO2 05/18/16 0855 98 %     Weight 05/18/16 0855 155 lb (70.3 kg)     Height 05/18/16 0855 5\' 9"  (1.753 m)     Head Circumference --      Peak Flow --      Pain Score 05/18/16 0856 6     Pain Loc --      Pain Edu? --      Excl. in GC? --     Constitutional: Alert and oriented. Well appearing and in no acute distress. Eyes: Conjunctivae are Mildly injected. PERRL. EOMI. no foreign body seen. Eyelids inverted without foreign body present. Head: Atraumatic. Nose: No congestion/rhinnorhea. Neck: No stridor.   Cardiovascular: Normal rate, regular rhythm. Grossly normal heart sounds.  Good peripheral circulation. Respiratory: Normal respiratory effort.  No retractions. Lungs CTAB. Gastrointestinal: Soft and nontender. No distention.  Musculoskeletal: As appropriate and lower extremities without difficulty. Normal gait was noted. Neurologic:  Normal speech and language. No gross focal neurologic deficits are  appreciated. No gait instability. Skin:  Skin is warm, dry and intact. No rash noted. Psychiatric: Mood and affect are normal. Speech and behavior are normal.  ____________________________________________   LABS (all labs ordered are listed, but only abnormal results are displayed)  Labs Reviewed - No data to display   PROCEDURES  Procedure(s) performed: Tetracaine was used both eyes. Examination did not show any foreign body. Lids were inverted without foreign body noted. Sclera is mildly injected. Eyes were irrigated with eyewash in the  emergency room copiously.  Procedures  Critical Care performed: No  ____________________________________________   INITIAL IMPRESSION / ASSESSMENT AND PLAN / ED COURSE  Pertinent labs & imaging results that were available during my care of the patient were reviewed by me and considered in my medical decision making (see chart for details).  After thorough irrigation bilateral eyes. Patient was discharged with a prescription for Tobrex ophthalmic solution 2 drops each eye as directed. Patient is encouraged to follow-up with [facility] if any continued problems with his eyes. He was given a note to return to work today.      ____________________________________________   FINAL CLINICAL IMPRESSION(S) / ED DIAGNOSES  Final diagnoses:  Chemical conjunctivitis, bilateral      NEW MEDICATIONS STARTED DURING THIS VISIT:  Discharge Medication List as of 05/18/2016 10:44 AM    START taking these medications   Details  tobramycin (TOBREX) 0.3 % ophthalmic solution Place 2 drops into both eyes every 4 (four) hours., Starting Thu 05/18/2016, Print         Note:  This document was prepared using Dragon voice recognition software and may include unintentional dictation errors.    Tommi RumpsRhonda L Summers, PA-C 05/18/16 1111    Jennye MoccasinBrian S Quigley, MD 05/18/16 [phone]

## 2016-05-18 NOTE — ED Notes (Signed)
Says was at work and rubbed eyes and had machine oil on hands.  Says left eye burning .  Happened 745am.

## 2017-01-02 ENCOUNTER — Encounter: Payer: Self-pay | Admitting: Emergency Medicine

## 2017-01-02 ENCOUNTER — Ambulatory Visit
Admission: EM | Admit: 2017-01-02 | Discharge: 2017-01-02 | Disposition: A | Discharge status: Home / Self Care | Payer: 59 | Attending: Family Medicine | Admitting: Family Medicine

## 2017-01-02 DIAGNOSIS — R05 Cough: Secondary | ICD-10-CM | POA: Diagnosis not present

## 2017-01-02 DIAGNOSIS — B349 Viral infection, unspecified: Secondary | ICD-10-CM | POA: Diagnosis not present

## 2017-01-02 DIAGNOSIS — J069 Acute upper respiratory infection, unspecified: Secondary | ICD-10-CM

## 2017-01-02 DIAGNOSIS — B9789 Other viral agents as the cause of diseases classified elsewhere: Secondary | ICD-10-CM

## 2017-01-02 NOTE — Discharge Instructions (Signed)
Take over the counter medication as needed. Rest. Drink plenty of fluids.   Establish a primary doctor for regular follow up. Return to Urgent care for new or worsening concerns.

## 2017-01-02 NOTE — ED Triage Notes (Signed)
Patient c/o runny nose, sinus congestion and pressure, cough, and HAs for 2 days.

## 2017-01-02 NOTE — ED Provider Notes (Signed)
[facility] ____________________________________________  Time seen: Approximately 4:20 PM  I have reviewed the triage vital signs and the nursing notes.   HISTORY  Chief Complaint Sinus Problem and Cough   HPI Shawn Murray is a 31 y.o. male presented for evaluation of 2 days of runny nose, nasal congestion and cough. States sore throat is only occasionally scratchy and only said after multiple sneezes. Reports sneezing a lot. Denies any fevers. States overall just hasn't felt well. No over-the-counter medications taken for the same complaint. Also reports he has had a few episodes of diarrhea and some nausea today. States that he needs a work note as he had to leave work early this morning. Reports overall has continued to eat and drink well. Denies other aggravating or alleviating factors.Denies chest pain, shortness of breath, abdominal pain, or rash. Denies recent sickness. Denies recent antibiotic use. States his young son recently sick with similar. Denies other known sick contacts.   Past Medical History:  Diagnosis Date  . Heart murmur     There are no active problems to display for this patient.   Past Surgical History:  Procedure Laterality Date  . ADENOIDECTOMY    . HERNIA REPAIR    . TONSILLECTOMY       No current facility-administered medications for this encounter.  No current outpatient prescriptions on file.  Allergies Sulfa antibiotics and Aspirin  History reviewed. No pertinent family history.  Social History Social History  Substance Use Topics  . Smoking status: Former Games developer  . Smokeless tobacco: Never Used  . Alcohol use Yes    Review of Systems Constitutional: No fever/chills Eyes: No visual changes. ENT: No sore throat. Cardiovascular: Denies chest pain. Respiratory: Denies shortness of breath. Gastrointestinal: No abdominal pain. Genitourinary: Negative for dysuria. Musculoskeletal: Negative for back pain. Skin:  Negative for rash.   ____________________________________________   PHYSICAL EXAM:  VITAL SIGNS: ED Triage Vitals  Enc Vitals Group     BP 01/02/17 1606 (!) 149/87     Pulse Rate 01/02/17 1606 62     Resp 01/02/17 1606 16     Temp 01/02/17 1606 98.2 F (36.8 C)     Temp Source 01/02/17 1606 Oral     SpO2 01/02/17 1606 99 %     Weight 01/02/17 1603 165 lb (74.8 kg)     Height 01/02/17 1603  (1.778 m)     Head Circumference --      Peak Flow --      Pain Score 01/02/17 1604 4     Pain Loc --      Pain Edu? --      Excl. in GC? --     Constitutional: Alert and oriented. Well appearing and in no acute distress. Eyes: Conjunctivae are normal.  Head: Atraumatic. No sinus tenderness to palpation. No swelling. No erythema.  Ears: no erythema, normal TMs bilaterally.   Nose:Nasal congestion with clear rhinorrhea  Mouth/Throat: Mucous membranes are moist. No pharyngeal erythema. No exudate. Tonsils surgically absent. Neck: No stridor.  No cervical spine tenderness to palpation. Hematological/Lymphatic/Immunilogical: No cervical lymphadenopathy. Cardiovascular: Normal rate, regular rhythm. Grossly normal heart sounds.  Good peripheral circulation. Respiratory: Normal respiratory effort.  No retractions. No wheezes, rales or rhonchi. Good air movement.  Gastrointestinal: Soft and nontender.  Musculoskeletal: Ambulatory with steady gait. No cervical, thoracic or lumbar tenderness to palpation. Neurologic:  Normal speech and language. No gait instability. Skin:  Skin appears warm, dry Psychiatric: Mood and affect are normal.  Speech and behavior are normal.  ___________________________________________   LABS (all labs ordered are listed, but only abnormal results are displayed)  Labs Reviewed - No data to display ____________________________________________   PROCEDURES Procedures     INITIAL IMPRESSION / ASSESSMENT AND PLAN / ED COURSE  Pertinent labs & imaging  results that were available during my care of the patient were reviewed by me and considered in my medical decision making (see chart for details).  Well-appearing patient. No acute distress. Suspect viral upper respiratory infection. Declined strep swab. Encourage rest, fluids, over-the-counter cough and congestion medications. Work note given for today.Discussed indication, risks and benefits of medications with patient.  Discussed follow up with Primary care physician this week. Discussed follow up and return parameters including no resolution or any worsening concerns. Patient verbalized understanding and agreed to plan.   ____________________________________________   FINAL CLINICAL IMPRESSION(S) / ED DIAGNOSES  Final diagnoses:  Viral URI with cough     There are no discharge medications for this patient.   Note: This dictation was prepared with Dragon dictation along with smaller phrase technology. Any transcriptional errors that result from this process are unintentional.          Renford Dills, NP 01/02/17 1813

## 2017-03-03 ENCOUNTER — Emergency Department: Payer: 59

## 2017-03-03 ENCOUNTER — Other Ambulatory Visit: Payer: Self-pay

## 2017-03-03 ENCOUNTER — Emergency Department
Admission: EM | Admit: 2017-03-03 | Discharge: 2017-03-03 | Disposition: A | Discharge status: Home / Self Care | Payer: 59 | Attending: Emergency Medicine | Admitting: Emergency Medicine

## 2017-03-03 ENCOUNTER — Encounter: Payer: Self-pay | Admitting: Emergency Medicine

## 2017-03-03 DIAGNOSIS — M25511 Pain in right shoulder: Secondary | ICD-10-CM | POA: Diagnosis not present

## 2017-03-03 DIAGNOSIS — Z87891 Personal history of nicotine dependence: Secondary | ICD-10-CM | POA: Insufficient documentation

## 2017-03-03 MED ORDER — TRAMADOL HCL 50 MG PO TABS
50.0000 mg | ORAL_TABLET | Freq: Once | ORAL | Status: AC
  Administered 2017-03-03: 50 mg via ORAL
  Filled 2017-03-03: qty 1

## 2017-03-03 MED ORDER — TRAMADOL HCL 50 MG PO TABS: 50.0000 mg | ORAL_TABLET | Freq: Four times a day (QID) | ORAL | 0 refills | Status: DC | PRN

## 2017-03-03 MED ORDER — MELOXICAM 15 MG PO TABS: 15.0000 mg | ORAL_TABLET | Freq: Every day | ORAL | 2 refills | Status: AC

## 2017-03-03 NOTE — Discharge Instructions (Signed)
Keep the arm in the sling for at least 3 days, he may take your arm out occasionally throughout the day to move the elbow so is it does not get stiff, apply ice to the right shoulder at least 15 minutes 3 times a day, and if you are not better in 3-5 days follow-up with orthopedics, their phone number has been provided for you to call and make an appointment, if you have been given an anti-inflammatory and pain medication, do not drive or work heavy machinery if you are using the pain medication, return if worsening

## 2017-03-03 NOTE — ED Triage Notes (Signed)
States felt pain R shoulder yesterday when dog pulled leash. Was able to work all night. States pain increased.

## 2017-03-03 NOTE — ED Notes (Signed)
Pt to ed with c/o right shoulder pain.  Pt states he was walking dog on leash yesterday and the dog pulled and he felt a "pop"  Pt states unable to move right shoulder without severe pain.  Pt also reports pain occasionally goes into neck and right hand had swelling yesterday.  Pt reports numbness in right hand.

## 2017-03-03 NOTE — ED Provider Notes (Signed)
[facility] Emergency Department Provider Note  ____________________________________________   First MD Initiated Contact with Patient 03/03/17 1031     (approximate)  I have reviewed the triage vital signs and the nursing notes.   HISTORY  Chief Complaint Shoulder Pain    HPI Shawn Murray is a 31 y.o. male as he was walking his pit bull type dog yesterday and the dog pulled on the leash really hard, he complains of right shoulder pain, states he was able to work all night last night but the pain is worse today, states his hand is a little numb, cannot move the arm is normal, denies any other injury   Past Medical History:  Diagnosis Date  . Heart murmur     There are no active problems to display for this patient.   Past Surgical History:  Procedure Laterality Date  . ADENOIDECTOMY    . HERNIA REPAIR    . TONSILLECTOMY      Prior to Admission medications   Medication Sig Start Date End Date Taking? Authorizing Provider  meloxicam (MOBIC) 15 MG tablet Take 1 tablet (15 mg total) by mouth daily. 03/03/17 03/03/18  Faythe GheeFisher, Verbena Boeding W, PA  traMADol (ULTRAM) 50 MG tablet Take 1 tablet (50 mg total) by mouth every 6 (six) hours as needed. 03/03/17   Faythe GheeFisher, Verlon Carcione W, PA    Allergies Sulfa antibiotics and Aspirin  No family history on file.  Social History Social History   Tobacco Use  . Smoking status: Former Games developermoker  . Smokeless tobacco: Never Used  Substance Use Topics  . Alcohol use: Yes  . Drug use: No    Review of Systems  Constitutional: No fever/chills Eyes: No visual changes. ENT: No sore throat. Respiratory: Denies cough Genitourinary: Negative for dysuria. Musculoskeletal: Negative for back pain.  Positive for right shoulder pain Skin: Negative for rash.    ____________________________________________   PHYSICAL EXAM:  VITAL SIGNS: ED Triage Vitals [03/03/17 0947]  Enc Vitals Group     BP (!) 143/90     Pulse Rate  92     Resp 18     Temp 98.1 F (36.7 C)     Temp Source Oral     SpO2 98 %     Weight 190 lb (86.2 kg)     Height 6' (1.829 m)     Head Circumference      Peak Flow      Pain Score 10     Pain Loc      Pain Edu?      Excl. in GC?     Constitutional: Alert and oriented. Well appearing and in no acute distress. Eyes: Conjunctivae are normal.  Head: Atraumatic. Nose: No congestion/rhinnorhea. Mouth/Throat: Mucous membranes are moist.   Cardiovascular: Normal rate, regular rhythm.  Heart sounds are normal Respiratory: Normal respiratory effort.  No retractions lungs are clear to auscultation GU: deferred Musculoskeletal: Decreased range of motion of the right shoulder, pain is reproduced on palpation of the right shoulder, grips are equal bilaterally, neurovascular is intact Neurologic:  Normal speech and language.  Skin:  Skin is warm, dry and intact. No rash noted. Psychiatric: Mood and affect are normal. Speech and behavior are normal.  ____________________________________________   LABS (all labs ordered are listed, but only abnormal results are displayed)  Labs Reviewed - No data to display ____________________________________________   ____________________________________________  RADIOLOGY  X-ray of the right shoulder is negative  ____________________________________________   PROCEDURES  Procedure(s) performed: Sling was applied to the right shoulder by the tech, tramadol 50 mg p.o. was given upon discharge, his wife is driving      ____________________________________________   INITIAL IMPRESSION / ASSESSMENT AND PLAN / ED COURSE  Pertinent labs & imaging results that were available during my care of the patient were reviewed by me and considered in my medical decision making (see chart for details).  Patient is a 31 year old male with right shoulder pain, his x-ray is negative, sling was applied, he was given a prescription for meloxicam 15 mg and  tramadol 50 mg, on the [place] prescribers website he has not had any narcotic prescriptions since July, he is to follow-up with orthopedics if he is not better in 3-5 days, he was instructed to apply ice to the area, patient states he agrees with treatment plan and is discharged in stable condition      ____________________________________________   FINAL CLINICAL IMPRESSION(S) / ED DIAGNOSES  Final diagnoses:  Acute pain of right shoulder      NEW MEDICATIONS STARTED DURING THIS VISIT:  This SmartLink is deprecated. Use AVSMEDLIST instead to display the medication list for a patient.   Note:  This document was prepared using Dragon voice recognition software and may include unintentional dictation errors.    Faythe GheeFisher, Sicilia Killough W, PA 03/03/17 1129    Merrily Brittleifenbark, Neil, MD 03/03/17 1350

## 2017-03-31 ENCOUNTER — Other Ambulatory Visit: Payer: Self-pay

## 2017-03-31 ENCOUNTER — Emergency Department: Payer: 59

## 2017-03-31 ENCOUNTER — Encounter: Payer: Self-pay | Admitting: Emergency Medicine

## 2017-03-31 ENCOUNTER — Emergency Department
Admission: EM | Admit: 2017-03-31 | Discharge: 2017-03-31 | Disposition: A | Discharge status: Home / Self Care | Payer: 59 | Attending: Emergency Medicine | Admitting: Emergency Medicine

## 2017-03-31 DIAGNOSIS — I319 Disease of pericardium, unspecified: Secondary | ICD-10-CM

## 2017-03-31 DIAGNOSIS — Z9119 Patient's noncompliance with other medical treatment and regimen: Secondary | ICD-10-CM | POA: Insufficient documentation

## 2017-03-31 DIAGNOSIS — R079 Chest pain, unspecified: Secondary | ICD-10-CM | POA: Diagnosis present

## 2017-03-31 DIAGNOSIS — Z87891 Personal history of nicotine dependence: Secondary | ICD-10-CM | POA: Insufficient documentation

## 2017-03-31 LAB — URINE DRUG SCREEN, QUALITATIVE (ARMC ONLY)
Amphetamines, Ur Screen: NOT DETECTED
Barbiturates, Ur Screen: NOT DETECTED
Benzodiazepine, Ur Scrn: NOT DETECTED
CANNABINOID 50 NG, UR ~~LOC~~: POSITIVE — AB
COCAINE METABOLITE, UR ~~LOC~~: NOT DETECTED
MDMA (ECSTASY) UR SCREEN: NOT DETECTED
Methadone Scn, Ur: NOT DETECTED
OPIATE, UR SCREEN: NOT DETECTED
Phencyclidine (PCP) Ur S: NOT DETECTED
Tricyclic, Ur Screen: NOT DETECTED

## 2017-03-31 LAB — CBC WITH DIFFERENTIAL/PLATELET
BASOS ABS: 0 10*3/uL (ref 0–0.1)
Basophils Relative: 0 %
EOS PCT: 1 %
Eosinophils Absolute: 0.1 10*3/uL (ref 0–0.7)
HEMATOCRIT: 45.6 % (ref 40.0–52.0)
Hemoglobin: 15.9 g/dL (ref 13.0–18.0)
LYMPHS ABS: 1.8 10*3/uL (ref 1.0–3.6)
Lymphocytes Relative: 16 %
MCH: 29.6 pg (ref 26.0–34.0)
MCHC: 34.9 g/dL (ref 32.0–36.0)
MCV: 84.7 fL (ref 80.0–100.0)
MONO ABS: 0.5 10*3/uL (ref 0.2–1.0)
MONOS PCT: 5 %
NEUTROS ABS: 8.8 10*3/uL — AB (ref 1.4–6.5)
Neutrophils Relative %: 78 %
PLATELETS: 312 10*3/uL (ref 150–440)
RBC: 5.38 MIL/uL (ref 4.40–5.90)
RDW: 12.9 % (ref 11.5–14.5)
WBC: 11.2 10*3/uL — ABNORMAL HIGH (ref 3.8–10.6)

## 2017-03-31 LAB — ETHANOL: Alcohol, Ethyl (B): <10 mg/dL (ref <10)

## 2017-03-31 LAB — COMPREHENSIVE METABOLIC PANEL
ALBUMIN: 5.1 g/dL — AB (ref 3.5–5.0)
ALT: 21 U/L (ref 17–63)
ANION GAP: 11 (ref 5–15)
AST: 20 U/L (ref 15–41)
Alkaline Phosphatase: 62 U/L (ref 38–126)
BILIRUBIN TOTAL: 1 mg/dL (ref 0.3–1.2)
BUN: 13 mg/dL (ref 6–20)
CHLORIDE: 110 mmol/L (ref 101–111)
CO2: 21 mmol/L — AB (ref 22–32)
Calcium: 9.7 mg/dL (ref 8.9–10.3)
Creatinine, Ser: 0.84 mg/dL (ref 0.61–1.24)
GFR calc Af Amer: >60 mL/min (ref >60)
GFR calc non Af Amer: >60 mL/min (ref >60)
Glucose, Bld: 95 mg/dL (ref 65–99)
Potassium: 3.8 mmol/L (ref 3.5–5.1)
SODIUM: 142 mmol/L (ref 135–145)
Total Protein: 7.8 g/dL (ref 6.5–8.1)

## 2017-03-31 LAB — TROPONIN I: Troponin I: <0.03 ng/mL (ref <0.03)

## 2017-03-31 MED ORDER — LORAZEPAM 2 MG/ML IJ SOLN
1.0000 mg | Freq: Once | INTRAMUSCULAR | Status: AC
  Administered 2017-03-31: 1 mg via INTRAVENOUS

## 2017-03-31 MED ORDER — KETOROLAC TROMETHAMINE 30 MG/ML IJ SOLN
15.0000 mg | Freq: Once | INTRAMUSCULAR | Status: AC
  Administered 2017-03-31: 15 mg via INTRAVENOUS
  Filled 2017-03-31: qty 1

## 2017-03-31 MED ORDER — LORAZEPAM 2 MG/ML IJ SOLN
1.0000 mg | Freq: Once | INTRAMUSCULAR | Status: AC
  Administered 2017-03-31: 1 mg via INTRAVENOUS
  Filled 2017-03-31: qty 1

## 2017-03-31 MED ORDER — IBUPROFEN 600 MG PO TABS: 600.0000 mg | ORAL_TABLET | Freq: Three times a day (TID) | ORAL | 0 refills | Status: DC

## 2017-03-31 NOTE — ED Notes (Signed)
Pt has called mother to come and get him. Triage nurse, Elon JesterMichele, will notify this RN when ride comes.

## 2017-03-31 NOTE — ED Provider Notes (Signed)
[facility] Emergency Department Provider Note  ____________________________________________   First MD Initiated Contact with Patient 03/31/17 0309     (approximate)  I have reviewed the triage vital signs and the nursing notes.   HISTORY  Chief Complaint Chest Pain   HPI Bennie PieriniRoss C Amrhein is a 32 y.o. male who comes to the emergency department via EMS with sudden onset severe upper chest pain that began roughly an hour and a half prior to arrival.  The pain is sharp stabbing worse with deep inspiration worse with lying flat.  It is improved when sitting up.  EMS gave the patient 25 mcg of fentanyl and 324 mg of aspirin prior to arrival.  With fentanyl did help his pain minimally.  He has a past medical history of recurrent pericarditis of unclear etiology.  He has been noncompliant with his nonsteroidal medications as well as his colchicine.  He denies fevers or chills.  He denies abdominal pain nausea or vomiting.  He does report feeling significant life stressors recently although he does contract for safety.  Past Medical History:  Diagnosis Date  . Heart murmur     There are no active problems to display for this patient.   Past Surgical History:  Procedure Laterality Date  . ADENOIDECTOMY    . HERNIA REPAIR    . TONSILLECTOMY      Prior to Admission medications   Medication Sig Start Date End Date Taking? Authorizing Provider  ibuprofen (ADVIL,MOTRIN) 600 MG tablet Take 1 tablet (600 mg total) by mouth every 8 (eight) hours. 03/31/17   Merrily Brittleifenbark, Anistyn Graddy, MD  meloxicam (MOBIC) 15 MG tablet Take 1 tablet (15 mg total) by mouth daily. 03/03/17 03/03/18  Fisher, Roselyn BeringSusan W, PA-C  traMADol (ULTRAM) 50 MG tablet Take 1 tablet (50 mg total) by mouth every 6 (six) hours as needed. 03/03/17   Faythe GheeFisher, Susan W, PA-C    Allergies Sulfa antibiotics and Aspirin  No family history on file.  Social History Social History   Tobacco Use  . Smoking status: Former  Games developermoker  . Smokeless tobacco: Never Used  Substance Use Topics  . Alcohol use: Yes  . Drug use: No    Review of Systems Constitutional: No fever/chills Eyes: No visual changes. ENT: No sore throat. Cardiovascular: Positive for chest pain. Respiratory: Positive for shortness of breath. Gastrointestinal: No abdominal pain.  No nausea, no vomiting.  No diarrhea.  No constipation. Genitourinary: Negative for dysuria. Musculoskeletal: Negative for back pain. Skin: Negative for rash. Neurological: Negative for headaches, focal weakness or numbness.   ____________________________________________   PHYSICAL EXAM:  VITAL SIGNS: ED Triage Vitals  Enc Vitals Group     BP      Pulse      Resp      Temp      Temp src      SpO2      Weight      Height      Head Circumference      Peak Flow      Pain Score      Pain Loc      Pain Edu?      Excl. in GC?     Constitutional: Alert and oriented x4 anxious appearing hyperventilating Eyes: PERRL EOMI. Head: Atraumatic. Nose: No congestion/rhinnorhea. Mouth/Throat: No trismus Neck: No stridor.   Cardiovascular: Tachycardic rate, regular rhythm. Grossly normal heart sounds.  Good peripheral circulation. Respiratory: Increased respiratory effort lungs CTAB and moving good air Gastrointestinal:  Soft nontender Musculoskeletal: No lower extremity edema   Neurologic:  Normal speech and language. No gross focal neurologic deficits are appreciated. Skin:  Skin is warm, dry and intact. No rash noted. Psychiatric: Mood and affect are normal. Speech and behavior are normal.    ____________________________________________   DIFFERENTIAL includes but not limited to  Pericarditis, myocarditis, pericardial effusion, tamponade, pneumonia, URI ____________________________________________   LABS (all labs ordered are listed, but only abnormal results are displayed)  Labs Reviewed  COMPREHENSIVE METABOLIC PANEL - Abnormal; Notable for  the following components:      Result Value   CO2 21 (*)    Albumin 5.1 (*)    All other components within normal limits  CBC WITH DIFFERENTIAL/PLATELET - Abnormal; Notable for the following components:   WBC 11.2 (*)    Neutro Abs 8.8 (*)    All other components within normal limits  URINE DRUG SCREEN, QUALITATIVE ([facility] ONLY) - Abnormal; Notable for the following components:   Cannabinoid 50 Ng, Ur [place] POSITIVE (*)    All other components within normal limits  TROPONIN I  ETHANOL    Lab work reviewed by me cannabis positive but troponin negative __________________________________________  EKG  ED ECG REPORT I, Merrily Brittle, the attending physician, personally viewed and interpreted this ECG.  Date: 03/31/2017 EKG Time:  Rate: 82 Rhythm: normal sinus rhythm QRS Axis: normal Intervals: normal ST/T Wave abnormalities: Diffuse mild concave up ST elevation with no reciprocal depression Narrative Interpretation: no evidence of acute ischemia.  EKG is consistent with acute pericarditis  ____________________________________________  RADIOLOGY  Chest x-ray reviewed by me with no acute disease Bedside ultrasound performed by me with no pericardial effusion ____________________________________________   PROCEDURES  Procedure(s) performed: no  Procedures  Critical Care performed: no  Observation: no ____________________________________________   INITIAL IMPRESSION / ASSESSMENT AND PLAN / ED COURSE  Pertinent labs & imaging results that were available during my care of the patient were reviewed by me and considered in my medical decision making (see chart for details).  The patient has sharp, pleuritic positional chest pain and EKG which are consistent with acute pericarditis.  After 1 mg of lorazepam and 15 mg of Toradol his symptoms are remarkably improved.  His blood work is pending to evaluate for myocarditis although I do anticipate outpatient management.      After the second milligram of lorazepam the patient's symptoms are largely resolved.  I have encouraged him to begin taking ibuprofen once again and to follow-up with cardiology as an outpatient.  The patient verbalized understanding and agreement with the plan. ____________________________________________   FINAL CLINICAL IMPRESSION(S) / ED DIAGNOSES  Final diagnoses:  Pericarditis, unspecified chronicity, unspecified type      NEW MEDICATIONS STARTED DURING THIS VISIT:  This SmartLink is deprecated. Use AVSMEDLIST instead to display the medication list for a patient.   Note:  This document was prepared using Dragon voice recognition software and may include unintentional dictation errors.     Merrily Brittle, MD 03/31/17 [phone]

## 2017-03-31 NOTE — Discharge Instructions (Addendum)
Please take ibuprofen 3 times a day around-the-clock for the next week to help with your symptoms and make an appointment to establish care with cardiology for reevaluation.  Return to the emergency department sooner for any concerns whatsoever.  It was a pleasure to take care of you today, and thank you for coming to our emergency department.  If you have any questions or concerns before leaving please ask the nurse to grab me and I'm more than happy to go through your aftercare instructions again.  If you were prescribed any opioid pain medication today such as Norco, Vicodin, Percocet, morphine, hydrocodone, or oxycodone please make sure you do not drive when you are taking this medication as it can alter your ability to drive safely.  If you have any concerns once you are home that you are not improving or are in fact getting worse before you can make it to your follow-up appointment, please do not hesitate to call 911 and come back for further evaluation.  Merrily Brittle, MD  Results for orders placed or performed during the hospital encounter of 03/31/17  Comprehensive metabolic panel  Result Value Ref Range   Sodium 142 135 - 145 mmol/L   Potassium 3.8 3.5 - 5.1 mmol/L   Chloride 110 101 - 111 mmol/L   CO2 21 (L) 22 - 32 mmol/L   Glucose, Bld 95 65 - 99 mg/dL   BUN 13 6 - 20 mg/dL   Creatinine, Ser 6.57 0.61 - 1.24 mg/dL   Calcium 9.7 8.9 - 84.6 mg/dL   Total Protein 7.8 6.5 - 8.1 g/dL   Albumin 5.1 (H) 3.5 - 5.0 g/dL   AST 20 15 - 41 U/L   ALT 21 17 - 63 U/L   Alkaline Phosphatase 62 38 - 126 U/L   Total Bilirubin 1.0 0.3 - 1.2 mg/dL   GFR calc non Af Amer >60 >60 mL/min   GFR calc Af Amer >60 >60 mL/min   Anion gap 11 5 - 15  CBC with Differential  Result Value Ref Range   WBC 11.2 (H) 3.8 - 10.6 K/uL   RBC 5.38 4.40 - 5.90 MIL/uL   Hemoglobin 15.9 13.0 - 18.0 g/dL   HCT 96.2 95.2 - 84.1 %   MCV 84.7 80.0 - 100.0 fL   MCH 29.6 26.0 - 34.0 pg   MCHC 34.9 32.0 - 36.0 g/dL    RDW 32.4 40.1 - 02.7 %   Platelets 312 150 - 440 K/uL   Neutrophils Relative % 78 %   Neutro Abs 8.8 (H) 1.4 - 6.5 K/uL   Lymphocytes Relative 16 %   Lymphs Abs 1.8 1.0 - 3.6 K/uL   Monocytes Relative 5 %   Monocytes Absolute 0.5 0.2 - 1.0 K/uL   Eosinophils Relative 1 %   Eosinophils Absolute 0.1 0 - 0.7 K/uL   Basophils Relative 0 %   Basophils Absolute 0.0 0 - 0.1 K/uL  Troponin I  Result Value Ref Range   Troponin I <0.03 <0.03 ng/mL  Ethanol  Result Value Ref Range   Alcohol, Ethyl (B) <10 <10 mg/dL  Urine Drug Screen, Qualitative  Result Value Ref Range   Tricyclic, Ur Screen NONE DETECTED NONE DETECTED   Amphetamines, Ur Screen NONE DETECTED NONE DETECTED   MDMA (Ecstasy)Ur Screen NONE DETECTED NONE DETECTED   Cocaine Metabolite,Ur [place] NONE DETECTED NONE DETECTED   Opiate, Ur Screen NONE DETECTED NONE DETECTED   Phencyclidine (PCP) Ur S NONE DETECTED NONE DETECTED  Cannabinoid 50 Ng, Ur [place] POSITIVE (A) NONE DETECTED   Barbiturates, Ur Screen NONE DETECTED NONE DETECTED   Benzodiazepine, Ur Scrn NONE DETECTED NONE DETECTED   Methadone Scn, Ur NONE DETECTED NONE DETECTED   Dg Shoulder Right  Result Date: 03/03/2017 CLINICAL DATA:  "pop" in his right shoulder yesterday while walking his dog. Reports he was holding leash when dog suddenly ran forward. Reports pain to posterior of shoulder that is increased when attempting to rotate his head to the left. Denies any previous injury to right shoulder. Limited ROM. EXAM: RIGHT SHOULDER - 2+ VIEW COMPARISON:  None. FINDINGS: There is no evidence of fracture or dislocation. There is no evidence of arthropathy or other focal bone abnormality. Soft tissues are unremarkable. IMPRESSION: Negative. Electronically Signed   By: Corlis Leak  Hassell M.D.   On: 03/03/2017 11:04   Dg Chest Port 1 View  Result Date: 03/31/2017 CLINICAL DATA:  Initial evaluation for acute chest pain. EXAM: PORTABLE CHEST 1 VIEW COMPARISON:  Prior radiograph from  10/02/2010. FINDINGS: The cardiac and mediastinal silhouettes are stable in size and contour, and remain within normal limits. The lungs are normally inflated. No airspace consolidation, pleural effusion, or pulmonary edema is identified. There is no pneumothorax. No acute osseous abnormality identified. IMPRESSION: No active disease. Electronically Signed   By: Rise MuBenjamin  McClintock M.D.   On: 03/31/2017 03:25

## 2017-03-31 NOTE — ED Triage Notes (Signed)
Pt arrives via [facility] with c/o chest pain that started around 0200. Pt was diagnosed with pericarditis x 2 months ago which he reports that he did not follow up with a cardiologist. Pt is nervous and jerking at this time in triage.

## 2017-03-31 NOTE — ED Notes (Signed)
Per [facility], pt was given 324 mg aspirin by fire department on site. EMS reports that pt was given 25 mcg fentanyl.

## 2017-04-01 DIAGNOSIS — F4320 Adjustment disorder, unspecified: Secondary | ICD-10-CM | POA: Insufficient documentation

## 2017-04-01 DIAGNOSIS — F32A Depression, unspecified: Secondary | ICD-10-CM | POA: Insufficient documentation

## 2017-04-01 DIAGNOSIS — R45851 Suicidal ideations: Secondary | ICD-10-CM | POA: Insufficient documentation

## 2017-04-01 DIAGNOSIS — F439 Reaction to severe stress, unspecified: Secondary | ICD-10-CM | POA: Insufficient documentation

## 2017-04-01 DIAGNOSIS — F4310 Post-traumatic stress disorder, unspecified: Secondary | ICD-10-CM | POA: Insufficient documentation

## 2017-04-01 DIAGNOSIS — F419 Anxiety disorder, unspecified: Secondary | ICD-10-CM | POA: Insufficient documentation

## 2017-04-03 ENCOUNTER — Telehealth: Payer: Self-pay

## 2017-04-03 NOTE — Telephone Encounter (Signed)
Lmov for patient to call back They were seen in ED on 03/31/17 Will try again at a later time

## 2017-04-18 NOTE — Telephone Encounter (Signed)
Number was incorrect on file  Sent letter   Will await for patient to call us back  Nothing else needed

## 2017-10-22 ENCOUNTER — Emergency Department
Admission: EM | Admit: 2017-10-22 | Discharge: 2017-10-22 | Disposition: A | Discharge status: Home / Self Care | Payer: 59 | Attending: Emergency Medicine | Admitting: Emergency Medicine

## 2017-10-22 ENCOUNTER — Emergency Department: Payer: 59

## 2017-10-22 DIAGNOSIS — Z79899 Other long term (current) drug therapy: Secondary | ICD-10-CM | POA: Insufficient documentation

## 2017-10-22 DIAGNOSIS — Y929 Unspecified place or not applicable: Secondary | ICD-10-CM | POA: Insufficient documentation

## 2017-10-22 DIAGNOSIS — Z87891 Personal history of nicotine dependence: Secondary | ICD-10-CM | POA: Insufficient documentation

## 2017-10-22 DIAGNOSIS — L03119 Cellulitis of unspecified part of limb: Secondary | ICD-10-CM

## 2017-10-22 DIAGNOSIS — S40012A Contusion of left shoulder, initial encounter: Secondary | ICD-10-CM | POA: Insufficient documentation

## 2017-10-22 DIAGNOSIS — S1093XA Contusion of unspecified part of neck, initial encounter: Secondary | ICD-10-CM | POA: Insufficient documentation

## 2017-10-22 DIAGNOSIS — Y999 Unspecified external cause status: Secondary | ICD-10-CM | POA: Insufficient documentation

## 2017-10-22 DIAGNOSIS — S0101XA Laceration without foreign body of scalp, initial encounter: Secondary | ICD-10-CM

## 2017-10-22 DIAGNOSIS — S0083XA Contusion of other part of head, initial encounter: Secondary | ICD-10-CM | POA: Insufficient documentation

## 2017-10-22 DIAGNOSIS — Y939 Activity, unspecified: Secondary | ICD-10-CM | POA: Insufficient documentation

## 2017-10-22 DIAGNOSIS — L03114 Cellulitis of left upper limb: Secondary | ICD-10-CM | POA: Insufficient documentation

## 2017-10-22 DIAGNOSIS — S0990XA Unspecified injury of head, initial encounter: Secondary | ICD-10-CM

## 2017-10-22 MED ORDER — CEPHALEXIN 500 MG PO CAPS: 500.0000 mg | ORAL_CAPSULE | Freq: Four times a day (QID) | ORAL | 0 refills | Status: AC

## 2017-10-22 MED ORDER — LIDOCAINE-EPINEPHRINE 2 %-1:100000 IJ SOLN
INTRAMUSCULAR | Status: AC
  Administered 2017-10-22: 18:00:00
  Filled 2017-10-22: qty 1

## 2017-10-22 MED ORDER — OXYCODONE-ACETAMINOPHEN 5-325 MG PO TABS
1.0000 | ORAL_TABLET | Freq: Once | ORAL | Status: AC
  Administered 2017-10-22: 1 via ORAL
  Filled 2017-10-22: qty 1

## 2017-10-22 MED ORDER — HYDROCODONE-ACETAMINOPHEN 5-325 MG PO TABS: 1.0000 | ORAL_TABLET | Freq: Four times a day (QID) | ORAL | 0 refills | Status: AC | PRN

## 2017-10-22 NOTE — ED Notes (Signed)
Patient transported to CT 

## 2017-10-22 NOTE — Discharge Instructions (Signed)
Take the antibiotic as prescribed and finish the full 7-day course.  You may take the Vicodin as needed for pain although as soon as you are able to transition to over-the-counter medications such as ibuprofen or Tylenol, you should do so.  Return to the ER for suture removal in 7 days.  You should return sooner for new, worsening, persistent severe bleeding, weakness or lightheadedness, vomiting, fevers, worsening rash on your wrist, or any other new or worsening symptoms that concern you.

## 2017-10-22 NOTE — ED Notes (Signed)
Police at bedside speaking with pt
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
## 2017-10-29 ENCOUNTER — Other Ambulatory Visit: Payer: Self-pay

## 2017-10-29 ENCOUNTER — Ambulatory Visit
Admission: EM | Admit: 2017-10-29 | Discharge: 2017-10-29 | Disposition: A | Discharge status: Home / Self Care | Payer: 59 | Attending: Family Medicine | Admitting: Family Medicine

## 2017-10-29 ENCOUNTER — Encounter: Payer: Self-pay | Admitting: Emergency Medicine

## 2017-10-29 DIAGNOSIS — S0101XA Laceration without foreign body of scalp, initial encounter: Secondary | ICD-10-CM | POA: Diagnosis not present

## 2017-10-29 DIAGNOSIS — Z4802 Encounter for removal of sutures: Secondary | ICD-10-CM | POA: Diagnosis not present

## 2017-10-29 DIAGNOSIS — S0003XA Contusion of scalp, initial encounter: Secondary | ICD-10-CM
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
## 2017-10-29 NOTE — Discharge Instructions (Signed)
Follow up with primary care provider 

## 2019-12-01 ENCOUNTER — Ambulatory Visit
Admission: EM | Admit: 2019-12-01 | Discharge: 2019-12-01 | Disposition: A | Discharge status: Home / Self Care | Payer: Self-pay | Attending: Internal Medicine | Admitting: Internal Medicine

## 2019-12-01 ENCOUNTER — Other Ambulatory Visit: Payer: Self-pay

## 2019-12-01 DIAGNOSIS — R198 Other specified symptoms and signs involving the digestive system and abdomen: Secondary | ICD-10-CM | POA: Insufficient documentation

## 2019-12-01 DIAGNOSIS — R112 Nausea with vomiting, unspecified: Secondary | ICD-10-CM | POA: Insufficient documentation

## 2019-12-01 DIAGNOSIS — R1013 Epigastric pain: Secondary | ICD-10-CM | POA: Insufficient documentation

## 2019-12-01 DIAGNOSIS — Z20822 Contact with and (suspected) exposure to covid-19: Secondary | ICD-10-CM | POA: Insufficient documentation

## 2019-12-01 MED ORDER — ONDANSETRON HCL 4 MG PO TABS: 4.0000 mg | ORAL_TABLET | Freq: Three times a day (TID) | ORAL | 0 refills | Status: DC | PRN

## 2019-12-01 MED ORDER — FAMOTIDINE 20 MG PO TABS: 20.0000 mg | ORAL_TABLET | Freq: Two times a day (BID) | ORAL | 0 refills | Status: DC

## 2019-12-01 NOTE — ED Provider Notes (Signed)
[facility]    CSN: [id number] Arrival date & time: 12/01/19  1201      History   Chief Complaint Chief Complaint  Patient presents with  . Nausea    HPI Shawn Murray is a 34 y.o. male.   Patient presents for nausea, vomiting and upper abdominal discomfort.  Reports symptoms started Friday into Saturday morning.  He states he ate chili dog Friday evening and woke up with nausea and vomiting early Saturday morning.  He reports having a burning heartburn sensation most of the time currently.  He reports he has vomited a few times a day.  He has been trying to keep down small fluids such as water and Gatorade.  Has been eating some saltine crackers.  Reports nausea is his biggest concern does not want a vomit.  Has not had any fevers or chills.  There is not been any blood in the vomit.  Reports he has had loose stools for several months, no increase in this.  No blood or dark color.  Denies upper respiratory symptoms of cough, runny nose, headache or sore throat.  Urinating in his usual amount.  No painful urination or frequency urgency.  Reports his partner has similar symptoms beginning in late small amounts of the food.  He is also concerned for Covid as he had a Covid exposure work last week.  He is not vaccinated for Covid.     Past Medical History:  Diagnosis Date  . Heart murmur     There are no problems to display for this patient.   Past Surgical History:  Procedure Laterality Date  . ADENOIDECTOMY    . HERNIA REPAIR    . TONSILLECTOMY         Home Medications    Prior to Admission medications   Medication Sig Start Date End Date Taking? Authorizing Provider  famotidine (PEPCID) 20 MG tablet Take 1 tablet (20 mg total) by mouth 2 (two) times daily. 12/01/19   Ester Mabe, Veryl Speak, PA-C  ibuprofen (ADVIL,MOTRIN) 600 MG tablet Take 1 tablet (600 mg total) by mouth every 8 (eight) hours. 03/31/17   Merrily Brittle, MD  ondansetron (ZOFRAN) 4 MG tablet Take 1  tablet (4 mg total) by mouth every 8 (eight) hours as needed for nausea or vomiting. 12/01/19   Mcdaniel Ohms, Veryl Speak, PA-C  traMADol (ULTRAM) 50 MG tablet Take 1 tablet (50 mg total) by mouth every 6 (six) hours as needed. 03/03/17   Faythe Ghee, PA-C    Family History Family History  Problem Relation Age of Onset  . Lupus Mother   . Asthma Mother   . COPD Mother   . Diabetes Father   . Hypertension Father     Social History Social History   Tobacco Use  . Smoking status: Former Games developer  . Smokeless tobacco: Never Used  Vaping Use  . Vaping Use: Some days  Substance Use Topics  . Alcohol use: Not Currently  . Drug use: Yes    Types: Marijuana     Allergies   Sulfa antibiotics   Review of Systems Review of Systems   Physical Exam Triage Vital Signs ED Triage Vitals [12/01/19 1221]  Enc Vitals Group     BP      Pulse      Resp      Temp      Temp src      SpO2      Weight  Height      Head Circumference      Peak Flow      Pain Score 0     Pain Loc      Pain Edu?      Excl. in GC?    No data found.  Updated Vital Signs BP (!) 129/93 (BP Location: Right Arm)   Pulse 63   Temp 98.6 F (37 C) (Oral)   Resp 18   Ht 5\' 10"  (1.778 m)   Wt 170 lb (77.1 kg)   SpO2 99%   BMI 24.39 kg/m   Visual Acuity Right Eye Distance:   Left Eye Distance:   Bilateral Distance:    Right Eye Near:   Left Eye Near:    Bilateral Near:     Physical Exam Vitals and nursing note reviewed.  Constitutional:      General: He is not in acute distress.    Appearance: He is well-developed. He is not ill-appearing.  HENT:     Head: Normocephalic and atraumatic.     Mouth/Throat:     Mouth: Mucous membranes are moist.     Pharynx: Oropharynx is clear.  Eyes:     Extraocular Movements: Extraocular movements intact.     Conjunctiva/sclera: Conjunctivae normal.     Pupils: Pupils are equal, round, and reactive to light.  Cardiovascular:     Rate and Rhythm: Normal rate  and regular rhythm.     Heart sounds: No murmur heard.   Pulmonary:     Effort: Pulmonary effort is normal. No respiratory distress.     Breath sounds: Normal breath sounds.  Abdominal:     Palpations: Abdomen is soft.     Tenderness: There is no right CVA tenderness or left CVA tenderness.     Comments: Abdomen is soft and nondistended.  Bowel sounds normal.  Mild epigastric and right upper quadrant tenderness, described more as pressure.  No rebound or guarding.  No other tenderness.  Musculoskeletal:     Cervical back: Neck supple.     Right lower leg: No edema.     Left lower leg: No edema.  Skin:    General: Skin is warm and dry.  Neurological:     Mental Status: He is alert.      UC Treatments / Results  Labs (all labs ordered are listed, but only abnormal results are displayed) Labs Reviewed  SARS CORONAVIRUS 2 (TAT 6-24 HRS)    EKG   Radiology No results found.  Procedures Procedures (including critical care time)  Medications Ordered in UC Medications - No data to display  Initial Impression / Assessment and Plan / UC Course  I have reviewed the triage vital signs and the nursing notes.  Pertinent labs & imaging results that were available during my care of the patient were reviewed by me and considered in my medical decision making (see chart for details).     #Dyspepsia #Nausea and vomiting #Reflux symptoms #Exposure to Covid Patient 33 year old presenting with dyspepsia, nausea and vomiting and reflux symptoms.  Does have a Covid exposure, will send Covid test.  This may be related to reflux or food.  Afebrile with a reassuring exam here.  We will treat symptomatically with Pepcid and Zofran.  Encouraged him to establish with primary care.  Discussed strict return and emergency department precautions.  Patient verbalized understanding plan of care Final Clinical Impressions(s) / UC Diagnoses   Final diagnoses:  Dyspepsia  Close exposure to COVID-19  virus  Non-intractable vomiting with nausea, unspecified vomiting type  Symptoms of gastroesophageal reflux     Discharge Instructions     Take the pepcid 2 times a day Use zofran only as needed   If not improving over the next several days return to clinic or follow-up with primary care  If severely worsening symptoms to include severe belly pain, unable to tolerate liquids, high fevers go to emergency department  If your Covid-19 test is positive, you will receive a phone call from [facility] regarding your results. Negative test results are not called. Both positive and negative results area always visible on MyChart. If you do not have a MyChart account, sign up instructions are in your discharge papers.   Persons who are directed to care for themselves at home may discontinue isolation under the following conditions:  . At least 10 days have passed since symptom onset and . At least 24 hours have passed without running a fever (this means without the use of fever-reducing medications) and . Other symptoms have improved.  Persons infected with COVID-19 who never develop symptoms may discontinue isolation and other precautions 10 days after the date of their first positive COVID-19 test.        ED Prescriptions    Medication Sig Dispense Auth. Provider   famotidine (PEPCID) 20 MG tablet Take 1 tablet (20 mg total) by mouth 2 (two) times daily. 30 tablet Mayleen Borrero, Veryl Speak, PA-C   ondansetron (ZOFRAN) 4 MG tablet Take 1 tablet (4 mg total) by mouth every 8 (eight) hours as needed for nausea or vomiting. 6 tablet Annalysia Willenbring, Veryl Speak, PA-C     PDMP not reviewed this encounter.   Hermelinda Medicus, PA-C 12/01/19 1420
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
## 2019-12-01 NOTE — Discharge Instructions (Addendum)
Take the pepcid 2 times a day Use zofran only as needed   If not improving over the next several days return to clinic or follow-up with primary care  If severely worsening symptoms to include severe belly pain, unable to tolerate liquids, high fevers go to emergency department  If your Covid-19 test is positive, you will receive a phone call from [facility] regarding your results. Negative test results are not called. Both positive and negative results area always visible on MyChart. If you do not have a MyChart account, sign up instructions are in your discharge papers.   Persons who are directed to care for themselves at home may discontinue isolation under the following conditions:   At least 10 days have passed since symptom onset and  At least 24 hours have passed without running a fever (this means without the use of fever-reducing medications) and  Other symptoms have improved.  Persons infected with COVID-19 who never develop symptoms may discontinue isolation and other precautions 10 days after the date of their first positive COVID-19 test.

## 2019-12-02 LAB — SARS CORONAVIRUS 2 (TAT 6-24 HRS): SARS Coronavirus 2: NEGATIVE

## 2020-04-03 ENCOUNTER — Encounter: Payer: Self-pay | Admitting: Emergency Medicine

## 2020-04-03 ENCOUNTER — Other Ambulatory Visit: Payer: Self-pay

## 2020-04-03 ENCOUNTER — Ambulatory Visit
Admission: EM | Admit: 2020-04-03 | Discharge: 2020-04-03 | Disposition: A | Discharge status: Home / Self Care | Payer: Self-pay | Attending: Internal Medicine | Admitting: Internal Medicine

## 2020-04-03 DIAGNOSIS — M5010 Cervical disc disorder with radiculopathy, unspecified cervical region: Secondary | ICD-10-CM

## 2020-04-03 DIAGNOSIS — M62838 Other muscle spasm: Secondary | ICD-10-CM

## 2020-04-03 MED ORDER — DICLOFENAC SODIUM 75 MG PO TBEC: 75.0000 mg | DELAYED_RELEASE_TABLET | Freq: Two times a day (BID) | ORAL | 0 refills | Status: DC

## 2020-04-03 MED ORDER — CYCLOBENZAPRINE HCL 10 MG PO TABS: 10.0000 mg | ORAL_TABLET | Freq: Three times a day (TID) | ORAL | 0 refills | Status: DC
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
## 2020-04-03 NOTE — Discharge Instructions (Signed)
Follow up with orthopedics to have further work up for cervical spine pinched nerve

## 2020-04-03 NOTE — ED Triage Notes (Signed)
Pt c/o of left sided neck, shoulder and arm pain since yesterday after picking up child.

## 2021-03-15 ENCOUNTER — Other Ambulatory Visit: Payer: Self-pay

## 2021-03-15 ENCOUNTER — Ambulatory Visit
Admission: EM | Admit: 2021-03-15 | Discharge: 2021-03-15 | Disposition: A | Discharge status: Home / Self Care | Payer: Self-pay | Attending: Internal Medicine | Admitting: Internal Medicine

## 2021-03-15 DIAGNOSIS — M79609 Pain in unspecified limb: Secondary | ICD-10-CM

## 2021-03-15 DIAGNOSIS — R202 Paresthesia of skin: Secondary | ICD-10-CM

## 2021-03-15 MED ORDER — NAPROXEN 500 MG PO TABS: 500.0000 mg | ORAL_TABLET | Freq: Two times a day (BID) | ORAL | 0 refills | Status: DC

## 2021-03-15 NOTE — Discharge Instructions (Signed)
Symptoms and exam today seem most consistent with carpal tunnel symptoms in the right hand and wrist, with muscular pain in the elbow and shoulder as well.  Try the wrist brace at bedtime and when doing activities that typically aggravate symptoms.  Prescription for two weeks of naprosyn (anti inflammatory/pain reliever) was sent to the pharmacy.  Ice for 10-15 minutes to painful areas a couple times daily may also provide some relief.  Followup with an orthopedist or sports medicine provider if symptoms do not improve.

## 2021-03-15 NOTE — ED Triage Notes (Signed)
Patient is here for "Right Arm pain". Started "5 days ago". Originally had it for 2 days, it went away then recurrent again. Doing a job where it is very physically demanded. Note: Not a workers comp. Injury. Previous injury/fractures to that hand. Pain is mainly elbow/hand. "Feel's like pins/needles at times".

## 2021-03-15 NOTE — ED Provider Notes (Signed)
[facility]    CSN: [id number] Arrival date & time: 03/15/21  1003      History   Chief Complaint Chief Complaint  Patient presents with   Arm Pain (Right)    HPI Shawn Murray is a 35 y.o. male.  He presents today with 5 days history of pain in the right arm, extends from the shoulder to the elbow to the right wrist and hand.  He has had several hand fractures, and thinks this might make the hand more susceptible to pain.  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  Has numbness and tingling in the medial aspects of the hand and wrist, along with a sharp discomfort.  Also pain with extension of the elbow, limiting extension of the elbow, and extension of the right shoulder overhead, pain limiting extension of the shoulder overhead.  Preserved but painful range of motion at the wrist and in the hand.  HPI  Past Medical History:  Diagnosis Date   Heart murmur     There are no problems to display for this patient.   Past Surgical History:  Procedure Laterality Date   ADENOIDECTOMY     HERNIA REPAIR     TONSILLECTOMY         Home Medications    Prior to Admission medications   Medication Sig Start Date End Date Taking? Authorizing Provider  acetaminophen (TYLENOL) 500 MG tablet Take 500 mg by mouth every 6 (six) hours as needed.   Yes [provider]  ibuprofen (ADVIL) 200 MG tablet Take 200 mg by mouth every 6 (six) hours as needed.   Yes [provider]  naproxen (NAPROSYN) 500 MG tablet Take 1 tablet (500 mg total) by mouth 2 (two) times daily. 03/15/21  Yes Isa Rankin, MD  famotidine (PEPCID) 20 MG tablet Take 1 tablet (20 mg total) by mouth 2 (two) times daily. 12/01/19 04/03/20  Darr, Gerilyn Pilgrim, PA-C    Family History Family History  Problem Relation Age of Onset   Lupus Mother    Asthma Mother    COPD Mother     Diabetes Father    Hypertension Father     Social History Social History   Tobacco Use   Smoking status: Former   Smokeless tobacco: Never  Building services engineer Use: Former  Substance Use Topics   Alcohol use: Not Currently   Drug use: Yes    Types: Marijuana     Allergies   Sulfa antibiotics   Review of Systems Review of Systems see HPI   Physical Exam Triage Vital Signs ED Triage Vitals  Enc Vitals Group     BP 03/15/21 1058 134/68     Pulse Rate 03/15/21 1058 82     Resp 03/15/21 1058 18     Temp 03/15/21 1058 98.2 F (36.8 C)     Temp Source 03/15/21 1058 Oral     SpO2 03/15/21 1058 99 %     Weight 03/15/21 1056 164 lb 14.5 oz (74.8 kg)     Height --      Pain Score 03/15/21 1056 7     Pain Loc --     Updated Vital Signs BP 134/68 (BP Location: Left Arm)    Pulse 82    Temp 98.2 F (36.8 C) (Oral)    Resp 18  Wt 74.8 kg    SpO2 99%    BMI 25.07 kg/m   Physical Exam Constitutional:      General: He is not in acute distress.    Appearance: He is not ill-appearing or toxic-appearing.  HENT:     Head: Atraumatic.  Eyes:     Comments: Conjugate gaze observed  Cardiovascular:     Rate and Rhythm: Normal rate.  Pulmonary:     Effort: No respiratory distress.  Abdominal:     General: There is no distension.  Musculoskeletal:     Cervical back: Neck supple.     Comments: Walked into the urgent care independently Pain limits extension of the right arm at the shoulder overhead, able to internally and externally rotate, but painful Pain limits full extension of the right elbow compared to the left Appears to have full rotation of the right wrist, symmetric with the left, but painful Able to fully extend the right hand and make a fist  Skin:    General: Skin is warm and dry.     Comments: No cyanosis  Neurological:     Mental Status: He is alert.     Comments: Face is symmetric, speech is clear, coherent, logical     UC Treatments / Results   Labs (all labs ordered are listed, but only abnormal results are displayed) Labs Reviewed - No data to display No labs done at urgent care visit  EKG N/A  Radiology No results found. No imaging done at urgent care visit  Procedures Procedures (including critical care time) Right wrist splint was applied per clinical staff  Medications Ordered in UC Medications - No data to display No medications given at urgent care visit   Final Clinical Impressions(s) / UC Diagnoses   Final diagnoses:  Paresthesia and pain of right extremity     Discharge Instructions      Symptoms and exam today seem most consistent with carpal tunnel symptoms in the right hand and wrist, with muscular pain in the elbow and shoulder as well.  Try the wrist brace at bedtime and when doing activities that typically aggravate symptoms.  Prescription for two weeks of naprosyn (anti inflammatory/pain reliever) was sent to the pharmacy.  Ice for 10-15 minutes to painful areas a couple times daily may also provide some relief.  Followup with an orthopedist or sports medicine provider if symptoms do not improve.     ED Prescriptions     Medication Sig Dispense Auth. Provider   naproxen (NAPROSYN) 500 MG tablet Take 1 tablet (500 mg total) by mouth 2 (two) times daily. 30 tablet Isa Rankin, MD      PDMP not reviewed this encounter.   Isa Rankin, MD 03/16/21 1010

## 2021-04-04 ENCOUNTER — Other Ambulatory Visit: Payer: Self-pay

## 2021-04-04 ENCOUNTER — Emergency Department: Payer: Self-pay

## 2021-04-04 ENCOUNTER — Emergency Department
Admission: EM | Admit: 2021-04-04 | Discharge: 2021-04-04 | Disposition: A | Discharge status: Home / Self Care | Payer: Self-pay | Attending: Emergency Medicine | Admitting: Emergency Medicine

## 2021-04-04 DIAGNOSIS — M7711 Lateral epicondylitis, right elbow: Secondary | ICD-10-CM | POA: Insufficient documentation

## 2021-04-04 DIAGNOSIS — M79641 Pain in right hand: Secondary | ICD-10-CM

## 2021-04-04 DIAGNOSIS — R52 Pain, unspecified: Secondary | ICD-10-CM

## 2021-04-04 MED ORDER — NAPROXEN 500 MG PO TABS: 500.0000 mg | ORAL_TABLET | Freq: Two times a day (BID) | ORAL | 0 refills | Status: DC

## 2021-04-04 NOTE — ED Triage Notes (Signed)
Pt to ED for right arm swelling for a month. Denies injury. Went to UC and was told carpel tunnel

## 2021-04-04 NOTE — ED Provider Notes (Signed)
[facility] Provider Note    Event Date/Time   First MD Initiated Contact with Patient 04/04/21 1448     (approximate)   History   Arm Swelling   HPI  Shawn Murray is a 36 y.o. male   presents to the ED with complaint of right arm pain for approximately 1 month.  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  Is now to the point that he cannot squeeze the trigger without increasing his pain.  He also cannot lift objects that are heavy using his right hand.  He also gets history of hitting something in November  and the swelling is still present.  Patient states he was seen at [facility] where he was told that he had carpal tunnel.  Patient is a former smoker.  Currently rates his pain as 4 out of 10.      Physical Exam   Triage Vital Signs: ED Triage Vitals  Enc Vitals Group     BP 04/04/21 1244 135/78     Pulse Rate 04/04/21 1244 87     Resp 04/04/21 1244 18     Temp 04/04/21 1244 98.5 F (36.9 C)     Temp Source 04/04/21 1244 Oral     SpO2 04/04/21 1244 97 %     Weight 04/04/21 1245 165 lb 5.5 oz (75 kg)     Height 04/04/21 1446 5\' 8"  (1.727 m)     Head Circumference --      Peak Flow --      Pain Score 04/04/21 1244 4     Pain Loc --      Pain Edu? --      Excl. in GC? --     Most recent vital signs: Vitals:   04/04/21 1244  BP: 135/78  Pulse: 87  Resp: 18  Temp: 98.5 F (36.9 C)  SpO2: 97%     General: Awake, no distress.  CV:  Good peripheral perfusion.  Resp:  Normal effort.  Abd:  No distention.  Other:  On examination of the right hand there is some minimal soft tissue edema noted on the distal portion of the fourth and fifth metacarpal dorsally.  Skin is intact.  Patient is able to completely flex and extend his digits without any difficulty.  Capillary refills less than 3 seconds.  Radial pulses present.  On examination of the forearm patient is markedly tender on palpation of the lateral  epicondylar area.  No gross deformity noted in this area.  No skin discoloration or warmth is present.  Patient is able to flex and extend at the elbow without any difficulty.   ED Results / Procedures / Treatments   Labs (all labs ordered are listed, but only abnormal results are displayed) Labs Reviewed - No data to display     RADIOLOGY   PROCEDURES:  Critical Care performed: No  Procedures   MEDICATIONS ORDERED IN ED: Medications - No data to display   IMPRESSION / MDM / ASSESSMENT AND PLAN / ED COURSE  I reviewed the triage vital signs and the nursing notes.  Differential diagnosis includes, but is not limited to, new fracture to the right hand versus all fracture with nonunion.  Right elbow epicondylitis, tendinitis, tenosynovitis versus DVT.  36 year old male presents to the ED with complaint of right hand pain for approximately 2-1/2 months for which he is concerned about a possible fracture as he has in the  past hit things.  Patient also is experiencing pain around his elbow that causes his whole arm to hurt.  He is having difficulty making a fist and lifting objects with increased pain in the elbow area.  He denies any recent injury.  He was seen at [facility] where he was told he had carpal tunnel.  He has taken over-the-counter naproxen without any relief but is taking it infrequently.  On physical exam patient is markedly tender over the right lateral epicondylar area.  There is suspicion of 1/5 distal metacarpal fracture.  X-rays were reviewed and report was negative for fracture.  Also ultrasound report by radiologist was reviewed by myself and was negative for DVT.  Patient was made aware of these findings.  We discussed at length a tennis elbow brace that his mother already has and will apply to this area.  A prescription for naproxen 500 mg twice daily was sent to the pharmacy for him to take daily with food.  If not improving he is to follow-up with Dr. Martha Clan who is the  orthopedist on-call for further evaluation.  FINAL CLINICAL IMPRESSION(S) / ED DIAGNOSES   Final diagnoses:  Pain  Right lateral epicondylitis  Right hand pain     Rx / DC Orders   ED Discharge Orders          Ordered    naproxen (NAPROSYN) 500 MG tablet  2 times daily with meals        04/04/21 1538             Note:  This document was prepared using Dragon voice recognition software and may include unintentional dictation errors.   Tommi Rumps, PA-C 04/04/21 1600    Chesley Noon, MD 04/05/21 1000

## 2021-04-04 NOTE — Discharge Instructions (Signed)
Call make appointment with Dr. Martha Clan who is orthopedist on-call if your elbow is not improving.  Also obtain the tennis elbow brace that we discussed and apply.  A prescription for naproxen was sent to your pharmacy to take twice a day with food.  Discontinue taking this if it causes any upset stomach.  You may also apply ice to your elbow as needed.  X-ray of your right hand does not show a new fracture however it does show that most likely in the past you have had a fracture of the fifth metacarpal which is at the base of your fifth finger.  The naproxen should help with that as well.

## 2021-04-04 NOTE — ED Notes (Signed)
See triage note Presents with numbness to right arm  states sx's started about 1 month ago denies any injury  min  swelling noted  good pulses

## 2021-04-04 NOTE — ED Provider Triage Note (Signed)
Emergency Medicine Provider Triage Evaluation Note  HODGE SHIFFLER , a 36 y.o. male  was evaluated in triage.  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 No relief with Naprosyn.  Review of Systems  Positive: Right hand and arm numbness and tingling Negative: Fever  Physical Exam  BP 135/78    Pulse 87    Temp 98.5 F (36.9 C) (Oral)    Resp 18    Wt 75 kg    SpO2 97%    BMI 25.14 kg/m  Gen:   Awake, no distress   Resp:  Normal effort  MSK:   Moves extremities without difficulty  Other:    Medical Decision Making  Medically screening exam initiated at 12:47 PM.  Appropriate orders placed.  DAVYD GARRIDO was informed that the remainder of the evaluation will be completed by another provider, this initial triage assessment does not replace that evaluation, and the importance of remaining in the ED until their evaluation is complete.    Victorino Dike, FNP 04/04/21 1249

## 2021-05-03 ENCOUNTER — Ambulatory Visit
Admission: EM | Admit: 2021-05-03 | Discharge: 2021-05-03 | Disposition: A | Discharge status: Home / Self Care | Payer: Self-pay | Attending: Emergency Medicine | Admitting: Emergency Medicine

## 2021-05-03 ENCOUNTER — Other Ambulatory Visit: Payer: Self-pay

## 2021-05-03 DIAGNOSIS — Z20822 Contact with and (suspected) exposure to covid-19: Secondary | ICD-10-CM | POA: Insufficient documentation

## 2021-05-03 DIAGNOSIS — J069 Acute upper respiratory infection, unspecified: Secondary | ICD-10-CM | POA: Insufficient documentation

## 2021-05-03 LAB — RESP PANEL BY RT-PCR (FLU A&B, COVID) ARPGX2
Influenza A by PCR: NEGATIVE
Influenza B by PCR: NEGATIVE
SARS Coronavirus 2 by RT PCR: NEGATIVE

## 2021-05-03 MED ORDER — ALBUTEROL SULFATE HFA 108 (90 BASE) MCG/ACT IN AERS: 1.0000 | INHALATION_SPRAY | RESPIRATORY_TRACT | 0 refills | Status: DC | PRN

## 2021-05-03 MED ORDER — IBUPROFEN 600 MG PO TABS: 600.0000 mg | ORAL_TABLET | Freq: Four times a day (QID) | ORAL | 0 refills | Status: DC | PRN

## 2021-05-03 MED ORDER — AEROCHAMBER PLUS MISC: 2 refills | Status: DC

## 2021-05-03 NOTE — Discharge Instructions (Signed)
I will contact you if and only if your COVID or flu come back positive.  If you do not hear from me by the end of the day, you can assume that this is an upper respiratory infection.  You can also call here and get your lab results.  To plastering albuterol inhaler using your spacer every 4-6 hours as needed for coughing, wheezing, shortness of breath, Flonase, Mucinex D, 600 mg of ibuprofen combined with 1000 mg of Tylenol together 3-4 times a day as needed for headaches.  Saline nasal irrigation with a Milta Deiters med rinse and distilled water as often as you want.  Follow-up with a primary care provider of your choice see list below.  Here is a list of primary care providers who are taking new patients:  Dr. Otilio Miu [id number] [place] [address] [address] [place], [place] [address] [phone]  [place] [address]  [phone]  [facility] [address] [place], [place] [address] [phone]  [facility] [place]  [phone] [place], [place] [address]  Here are clinics/ other resources who will see you if you do not have insurance. Some have certain criteria that you must meet. Call them and find out what they are:  [facility]: [address], [place], [place] [address] Phone: [phone] Hours: First and Third Saturdays of each Month, 9 a.m. - 1 p.m.  [facility]: [address] [place], [place], [place] [address] Phone: [phone] Hours: Tuesday, 4 p.m. - 8 p.m. Thursday, 1 p.m. - 8 p.m. Wednesday, 9 a.m. - [facility] [address] [place], [place] [address] Phone: [phone] Pharmacy Phone Number: [phone] Dental Phone Number: [phone] [facility]: [phone]  Dental Hours: Monday - Thursday, 8 a.m. - 6 p.m.  [facility] [address],  [place], [place] [address] Phone: [phone] Pharmacy Phone Number: [phone] [facility] Insurance Help: [phone]  [facility] [address] [place], [place] [address] Phone: [phone] Pharmacy Phone Number: [phone] [facility] Insurance Help: [phone]  [facility] [address] [place], [place] [address] Phone: [phone] [facility] Insurance Help: [phone]   [address] [place], [place] [address] Phone: [phone]  Go to www.goodrx.com  or www.costplusdrugs.com to look up your medications. This will give you a list of where you can find your prescriptions at the most affordable prices. Or ask the pharmacist what the cash price is, or if they have any other discount programs available to help make your medication more affordable. This can be less expensive than what you would pay with insurance.

## 2021-05-03 NOTE — ED Provider Notes (Signed)
HPI  SUBJECTIVE:  Shawn Murray is a 36 y.o. male who presents with 2 to 3 days of intermittent nausea, decreased appetite, fatigue, fevers Tmax 100.0, headaches, nasal congestion, rhinorrhea, slight cough, slight sore throat secondary to the cough, wheezing, shortness of breath with exertion, diarrhea.  He is unable to sleep at night secondary to the cough.  No body aches, postnasal drip, loss of sense of smell or taste, vomiting, abdominal pain.  No known COVID or flu exposure.  He did not get the COVID or flu vaccines.  He reports maxillary sinus pain and pressure more on the right than the left.  No facial swelling, upper dental pain.  He has a past medical history of COVID in 2020, pericarditis, smokes marijuana only.  He has a 20+ year pack history of smoking, quit 7 years ago.  No history of pulmonary disease, diabetes, hypertension, chronic kidney disease.  PMD: None.    Past Medical History:  Diagnosis Date   Heart murmur     Past Surgical History:  Procedure Laterality Date   ADENOIDECTOMY     HERNIA REPAIR     TONSILLECTOMY      Family History  Problem Relation Age of Onset   Lupus Mother    Asthma Mother    COPD Mother    Diabetes Father    Hypertension Father     Social History   Tobacco Use   Smoking status: Former   Smokeless tobacco: Never  Scientific laboratory technician Use: Former  Substance Use Topics   Alcohol use: Not Currently   Drug use: Yes    Types: Marijuana    No current facility-administered medications for this encounter.  Current Outpatient Medications:    albuterol (VENTOLIN HFA) 108 (90 Base) MCG/ACT inhaler, Inhale 1-2 puffs into the lungs every 4 (four) hours as needed for wheezing or shortness of breath., Disp: 1 each, Rfl: 0   ibuprofen (ADVIL) 600 MG tablet, Take 1 tablet (600 mg total) by mouth every 6 (six) hours as needed., Disp: 30 tablet, Rfl: 0   Spacer/Aero-Holding Chambers (AEROCHAMBER PLUS) inhaler, Use with inhaler, Disp: 1 each, Rfl:  2  Allergies  Allergen Reactions   Sulfa Antibiotics Hives   Sulfabenzamide Hives     ROS  As noted in HPI.   Physical Exam  BP (!) 141/73 (BP Location: Left Arm)    Pulse 70    Temp 98.6 F (37 C) (Oral)    Resp 16    SpO2 100%   Constitutional: Well developed, well nourished, no acute distress Eyes:  EOMI, conjunctiva normal bilaterally HENT: Normocephalic, atraumatic,mucus membranes moist.  Erythematous, swollen turbinates.  Positive right-sided maxillary sinus tenderness.  Clear rhinorrhea.  Positive postnasal drip.  Tonsils surgically absent.  Uvula midline. Neck: No cervical adenopathy Respiratory: Normal inspiratory effort, no appreciable wheeze Cardiovascular: Normal rate, regular rhythm, no appreciable murmur GI: nondistended skin: No rash, skin intact Musculoskeletal: no deformities Neurologic: Alert & oriented x 3, no focal neuro deficits Psychiatric: Speech and behavior appropriate   ED Course   Medications - No data to display  Orders Placed This Encounter  Procedures   Resp Panel by RT-PCR (Flu A&B, Covid) Nasopharyngeal Swab    Standing Status:   Standing    Number of Occurrences:   1   Airborne and Contact precautions    Standing Status:   Standing    Number of Occurrences:   1    Results for orders placed or performed during  the hospital encounter of 05/03/21 (from the past 24 hour(s))  Resp Panel by RT-PCR (Flu A&B, Covid) Nasopharyngeal Swab     Status: None   Collection Time: 05/03/21  9:15 AM   Specimen: Nasopharyngeal Swab; Nasopharyngeal(NP) swabs in vial transport medium  Result Value Ref Range   SARS Coronavirus 2 by RT PCR NEGATIVE NEGATIVE   Influenza A by PCR NEGATIVE NEGATIVE   Influenza B by PCR NEGATIVE NEGATIVE   No results found.  ED Clinical Impression  1. Upper respiratory tract infection, unspecified type   2. Encounter for laboratory testing for COVID-19 virus      ED Assessment/Plan  COVID, flu PCR sent.  He  qualifies for antivirals because he is not vaccinated.  Will contact patient at [phone] only if either 1 of these are positive and we will prescribe the appropriate antiviral.  In the meantime, suspect the patient is having some bronchospasm from being out in the cold-works outside.  Will send home with an albuterol inhaler with a spacer, Flonase, Mucinex D, Tylenol/ibuprofen, saline nasal irrigation.  Work note for today.  Will provide primary care list and order assistance in finding a PMD.  COVID, influenza PCR negative.  Patient with a URI.  Plan as above.  Discussed labs MDM, treatment plan, and plan for follow-up with patient. patient agrees with plan.   Meds ordered this encounter  Medications   ibuprofen (ADVIL) 600 MG tablet    Sig: Take 1 tablet (600 mg total) by mouth every 6 (six) hours as needed.    Dispense:  30 tablet    Refill:  0   albuterol (VENTOLIN HFA) 108 (90 Base) MCG/ACT inhaler    Sig: Inhale 1-2 puffs into the lungs every 4 (four) hours as needed for wheezing or shortness of breath.    Dispense:  1 each    Refill:  0   Spacer/Aero-Holding Chambers (AEROCHAMBER PLUS) inhaler    Sig: Use with inhaler    Dispense:  1 each    Refill:  2    Please educate patient on use      *This clinic note was created using Dragon dictation software. Therefore, there may be occasional mistakes despite careful proofreading.  ?    Melynda Ripple, MD 05/04/21 [phone]

## 2021-05-03 NOTE — ED Triage Notes (Signed)
Pt reports, fatigue, cough, nausea and fever x 3 days.

## 2021-05-04 ENCOUNTER — Telehealth: Payer: Self-pay

## 2021-05-04 NOTE — Telephone Encounter (Signed)
-----   Message from Aaron Edelman, RN sent at 05/04/2021 11:01 AM EST ----- Regarding: UC to PCP Patient needs to establish with PCP - routine

## 2021-05-04 NOTE — Telephone Encounter (Signed)
Talk with patient he does not have insurance and will call back later date.

## 2021-09-09 ENCOUNTER — Ambulatory Visit
Admission: EM | Admit: 2021-09-09 | Discharge: 2021-09-09 | Disposition: A | Discharge status: Home / Self Care | Payer: Self-pay | Attending: Emergency Medicine | Admitting: Emergency Medicine

## 2021-09-09 DIAGNOSIS — K625 Hemorrhage of anus and rectum: Secondary | ICD-10-CM | POA: Insufficient documentation

## 2021-09-09 LAB — CBC WITH DIFFERENTIAL/PLATELET
Abs Immature Granulocytes: 0.03 10*3/uL (ref 0.00–0.07)
Basophils Absolute: 0.1 10*3/uL (ref 0.0–0.1)
Basophils Relative: 1 %
Eosinophils Absolute: 0.3 10*3/uL (ref 0.0–0.5)
Eosinophils Relative: 3 %
HCT: 43.7 % (ref 39.0–52.0)
Hemoglobin: 15.4 g/dL (ref 13.0–17.0)
Immature Granulocytes: 0 %
Lymphocytes Relative: 25 %
Lymphs Abs: 2.2 10*3/uL (ref 0.7–4.0)
MCH: 30.4 pg (ref 26.0–34.0)
MCHC: 35.2 g/dL (ref 30.0–36.0)
MCV: 86.2 fL (ref 80.0–100.0)
Monocytes Absolute: 0.7 10*3/uL (ref 0.1–1.0)
Monocytes Relative: 8 %
Neutro Abs: 5.4 10*3/uL (ref 1.7–7.7)
Neutrophils Relative %: 63 %
Platelets: 247 10*3/uL (ref 150–400)
RBC: 5.07 MIL/uL (ref 4.22–5.81)
RDW: 12.4 % (ref 11.5–15.5)
WBC: 8.6 10*3/uL (ref 4.0–10.5)
nRBC: 0 % (ref 0.0–0.2)

## 2021-09-09 NOTE — ED Triage Notes (Signed)
Onset 2-3 months pt reports some rectal bleeding after his bowel movement.

## 2021-09-09 NOTE — ED Provider Notes (Addendum)
HPI  SUBJECTIVE:  Shawn Murray is a 36 y.o. male who presents with 3 episodes of painless rectal bleeding after stooling starting yesterday.  He notes blood on the tissue and in the toilet bowl.  He states that he has some mild rectal itching.  No abdominal pain.  No perirectal masses, pain with bowel movements, recent constipation.  He denies trauma to the area, insertion of foreign bodies.  No melena, maroon-colored stools, nausea, vomiting, epistaxis, shortness of breath, gingival bleeding, hematuria.  He denies rectal discharge or bleeding otherwise.  He tried Tylenol without improvement in his symptoms.  Symptoms are worse with having a bowel movement.  Past medical history negative for GI bleed, coagulopathy, thrombocytopenia.  PCP: None.   Past Medical History:  Diagnosis Date   Heart murmur     Past Surgical History:  Procedure Laterality Date   ADENOIDECTOMY     HERNIA REPAIR     TONSILLECTOMY      Family History  Problem Relation Age of Onset   Lupus Mother    Asthma Mother    COPD Mother    Diabetes Father    Hypertension Father     Social History   Tobacco Use   Smoking status: Former   Smokeless tobacco: Never  Building services engineer Use: Former  Substance Use Topics   Alcohol use: Not Currently   Drug use: Yes    Types: Marijuana    No current facility-administered medications for this encounter. No current outpatient medications on file.  Allergies  Allergen Reactions   Sulfa Antibiotics Hives   Sulfabenzamide Hives     ROS  As noted in HPI.   Physical Exam  BP (!) 141/65 (BP Location: Left Arm)   Pulse (!) 48   Temp 98.7 F (37.1 C) (Oral)   Resp 18   SpO2 98%   Constitutional: Well developed, well nourished, no acute distress Eyes:  EOMI, conjunctiva normal bilaterally HENT: Normocephalic, atraumatic,mucus membranes moist Respiratory: Normal inspiratory effort Cardiovascular: Normal rate GI: nondistended, soft, nontender. Rectal:  Normal external appearance.  Positive flecks of dried blood.  No tenderness, no fissures.  No external hemorrhoids.  No prolapsing internal hemorrhoids with Valsalva.  Anoscope not available here.  Positive scant red blood on glove. skin: No rash, skin intact Musculoskeletal: no deformities Neurologic: Alert & oriented x 3, no focal neuro deficits Psychiatric: Speech and behavior appropriate   ED Course   Medications - No data to display  Orders Placed This Encounter  Procedures   CBC with Differential    Standing Status:   Standing    Number of Occurrences:   1   Ambulatory referral to Gastroenterology    Referral Priority:   Routine    Referral Type:   Consultation    Referral Reason:   Specialty Services Required    Number of Visits Requested:   1   Nursing Communication Please set up with PCP prior to discharge.    Please set up with PCP prior to discharge.    Standing Status:   Standing    Number of Occurrences:   1    Results for orders placed or performed during the hospital encounter of 09/09/21 (from the past 24 hour(s))  CBC with Differential     Status: None   Collection Time: 09/09/21 10:02 AM  Result Value Ref Range   WBC 8.6 4.0 - 10.5 K/uL   RBC 5.07 4.22 - 5.81 MIL/uL   Hemoglobin 15.4 13.0 -  17.0 g/dL   HCT 76.1 60.7 - 37.1 %   MCV 86.2 80.0 - 100.0 fL   MCH 30.4 26.0 - 34.0 pg   MCHC 35.2 30.0 - 36.0 g/dL   RDW 06.2 69.4 - 85.4 %   Platelets 247 150 - 400 K/uL   nRBC 0.0 0.0 - 0.2 %   Neutrophils Relative % 63 %   Neutro Abs 5.4 1.7 - 7.7 K/uL   Lymphocytes Relative 25 %   Lymphs Abs 2.2 0.7 - 4.0 K/uL   Monocytes Relative 8 %   Monocytes Absolute 0.7 0.1 - 1.0 K/uL   Eosinophils Relative 3 %   Eosinophils Absolute 0.3 0.0 - 0.5 K/uL   Basophils Relative 1 %   Basophils Absolute 0.1 0.0 - 0.1 K/uL   Immature Granulocytes 0 %   Abs Immature Granulocytes 0.03 0.00 - 0.07 K/uL   No results found.  ED Clinical Impression  1. Rectal bleeding       ED Assessment/Plan  Patient with 2 episodes of rectal bleeding.  I suspect an internal hemorrhoid.  Doubt upper or lower GI bleed.  His abdomen is benign.  His vitals are stable, his mucous membranes are pink, doubt acute anemia, but we will check a CBC to rule out thrombocytopenia.  Discussed with patient that I will contact him only if it was abnormal and required urgent intervention.  Deferring Hemoccult as he had some blood on the glove and it would not change management.   will set up PCP follow-up prior to discharge.  will also place routine GI referral if symptoms persist.  CBC normal.  Discussed labs, MDM, treatment plan, and plan for follow-up with patient. Discussed sn/sx that should prompt return to the ED. patient agrees with plan.   No orders of the defined types were placed in this encounter.     *This clinic note was created using Dragon dictation software. Therefore, there may be occasional mistakes despite careful proofreading.  ?    Domenick Gong, MD 09/09/21 1038    Domenick Gong, MD 09/09/21 1039

## 2021-09-09 NOTE — Discharge Instructions (Addendum)
I will call you if and only if your CBC is abnormal and requires urgent intervention.  I have placed a referral to GI to follow-up with in case this persists. Go to the ED for the signs and symptoms we discussed

## 2021-09-09 NOTE — ED Notes (Signed)
PCP appt created, walked through MyChart steps.  Advised pt to reach out to PCP re cost of est care visit and setting up financial assistance.

## 2021-09-13 ENCOUNTER — Ambulatory Visit
Admission: EM | Admit: 2021-09-13 | Discharge: 2021-09-13 | Disposition: A | Discharge status: Home / Self Care | Payer: Self-pay | Attending: Internal Medicine | Admitting: Internal Medicine

## 2021-09-13 ENCOUNTER — Ambulatory Visit (INDEPENDENT_AMBULATORY_CARE_PROVIDER_SITE_OTHER): Payer: Self-pay

## 2021-09-13 DIAGNOSIS — M542 Cervicalgia: Secondary | ICD-10-CM

## 2021-09-13 MED ORDER — NAPROXEN 500 MG PO TABS: 500.0000 mg | ORAL_TABLET | Freq: Two times a day (BID) | ORAL | 0 refills | Status: DC

## 2021-09-13 MED ORDER — KETOROLAC TROMETHAMINE 60 MG/2ML IM SOLN
60.0000 mg | Freq: Once | INTRAMUSCULAR | Status: AC
  Administered 2021-09-13: 60 mg via INTRAMUSCULAR

## 2021-09-13 NOTE — ED Triage Notes (Signed)
Patient presents to Urgent Care with complaints of shoulder and neck pain since yesterday. He states he thinks he pulled something and now neck is stiff. Pain increases with movement of left arm and neck. Not taking anything for pain.

## 2021-09-13 NOTE — Discharge Instructions (Signed)
Symptoms and exam today are consistent with a soft tissue injury to the left lateral neck.  Xray of the neck was negative for bony injury or fracture.  Injection of ketorolac (anti inflammatory/pain reliever) was given at the urgent care to help with pain.  Prescription for naproxen (anti inflammatory/pain reliever) was sent to the pharmacy.  Ice or heat for 10-15 minutes, several times daily, may also help decrease pain.  Note for work today/tomorrow.  Anticipate gradual improvement in pain over the next 2-4 weeks.  Physical therapy can be helpful in improving pain and functioning, if symptoms are taking a while to subside.
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
## 2021-10-26 ENCOUNTER — Encounter: Payer: Self-pay | Admitting: Family Medicine

## 2021-10-26 ENCOUNTER — Ambulatory Visit (INDEPENDENT_AMBULATORY_CARE_PROVIDER_SITE_OTHER): Payer: Self-pay | Admitting: Family Medicine

## 2021-10-26 VITALS — BP 142/76 | HR 78 | Ht 68.0 in | Wt 162.0 lb

## 2021-10-26 DIAGNOSIS — F32A Depression, unspecified: Secondary | ICD-10-CM

## 2021-10-26 DIAGNOSIS — F419 Anxiety disorder, unspecified: Secondary | ICD-10-CM

## 2021-10-26 MED ORDER — SERTRALINE HCL 50 MG PO TABS: ORAL_TABLET | ORAL | 0 refills | Status: DC

## 2021-10-26 MED ORDER — HYDROXYZINE PAMOATE 25 MG PO CAPS: 25.0000 mg | ORAL_CAPSULE | Freq: Three times a day (TID) | ORAL | 0 refills | Status: DC | PRN

## 2021-10-26 NOTE — Patient Instructions (Signed)
-   Dose 1/2 tablet sertraline (Zoloft) x1 week - After 1 week dose 1 tablet sertraline (Zoloft) 50 mg daily - Can use buspirone every 8 hours as needed for anxiety episodes - Referral coordinator will contact you to help with insurance as well as scheduling visit with psychiatry - Contact our office via MyChart or phone call after the above so that we can set up follow-up - Can also contact for any questions/concerns between now and then
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
## 2021-10-28 ENCOUNTER — Ambulatory Visit: Payer: Self-pay | Admitting: Family Medicine

## 2021-11-10 ENCOUNTER — Other Ambulatory Visit: Payer: Self-pay | Admitting: Family Medicine

## 2021-11-10 DIAGNOSIS — F419 Anxiety disorder, unspecified: Secondary | ICD-10-CM

## 2021-11-11 NOTE — Telephone Encounter (Signed)
Requested medication (s) are due for refill today: yes  Requested medication (s) are on the active medication list: yes  Last refill:  Hydroxyzine 10/26/21 #30 with 0 RF, Sertraline 10/26/21 #70 with 0 RF   Future visit scheduled: no upcoming appt, seen 10/26/21  Notes to clinic:  Failed protocol of labs within 12 months, labs from 2019, no upcoming appt, just seen 10/26/21, please assess.       Requested Prescriptions  Pending Prescriptions Disp Refills   hydrOXYzine (VISTARIL) 25 MG capsule [Pharmacy Med Name: HYDROXYZINE PAM 25 MG CAP[*]] 30 capsule 0    Sig: TAKE ONE CAPSULE BY MOUTH EVERY 8 HOURS AS NEEDED     Ear, Nose, and Throat:  Antihistamines 2 Failed - 11/10/2021  9:43 AM      Failed - Cr in normal range and within 360 days    Creatinine  Date Value Ref Range Status  06/12/2012 0.69 0.60 - 1.30 mg/dL Final   Creatinine, Ser  Date Value Ref Range Status  03/31/2017 0.84 0.61 - 1.24 mg/dL Final         Passed - Valid encounter within last 12 months    Recent Outpatient Visits           2 weeks ago Anxiety and depression   [place] [facility] at [facility], Ocie Bob, MD               sertraline (ZOLOFT) 50 MG tablet [Pharmacy Med Name: SERTRALINE 50 MG TAB] 70 tablet 0    Sig: TAKE ONE-HALF TABLET BY MOUTH ONE TIME DAILY FOR 7 DAYS, THEN TAKE ONE TABLET BY MOUTH ONE TIME DAILY     Psychiatry:  Antidepressants - SSRI - sertraline Failed - 11/10/2021  9:43 AM      Failed - AST in normal range and within 360 days    AST  Date Value Ref Range Status  03/31/2017 20 15 - 41 U/L Final         Failed - ALT in normal range and within 360 days    ALT  Date Value Ref Range Status  03/31/2017 21 17 - 63 U/L Final         Passed - Completed PHQ-2 or PHQ-9 in the last 360 days      Passed - Valid encounter within last 6 months    Recent Outpatient Visits           2 weeks ago Anxiety and depression   [place] [facility] at [facility], Ocie Bob, MD

## 2021-11-26 ENCOUNTER — Other Ambulatory Visit: Payer: Self-pay | Admitting: Family Medicine

## 2021-11-26 DIAGNOSIS — F32A Depression, unspecified: Secondary | ICD-10-CM

## 2021-11-29 ENCOUNTER — Other Ambulatory Visit: Payer: Self-pay

## 2021-11-29 NOTE — Telephone Encounter (Signed)
Requested medication (s) are due for refill today: yes  Requested medication (s) are on the active medication list: yes  Last refill:  10/26/21 #30   Future visit scheduled: no  Notes to clinic:  overdue lab work-   Requested Prescriptions  Pending Prescriptions Disp Refills   hydrOXYzine (VISTARIL) 25 MG capsule [Pharmacy Med Name: HYDROXYZINE PAM 25 MG CAP[*]] 30 capsule 0    Sig: TAKE ONE CAPSULE BY MOUTH EVERY 8 HOURS AS NEEDED     Ear, Nose, and Throat:  Antihistamines 2 Failed - 11/26/2021 10:20 AM      Failed - Cr in normal range and within 360 days    Creatinine  Date Value Ref Range Status  06/12/2012 0.69 0.60 - 1.30 mg/dL Final   Creatinine, Ser  Date Value Ref Range Status  03/31/2017 0.84 0.61 - 1.24 mg/dL Final         Passed - Valid encounter within last 12 months    Recent Outpatient Visits           1 month ago Anxiety and depression   [place] [facility] at [facility], Ocie Bob, MD

## 2021-12-26 ENCOUNTER — Other Ambulatory Visit: Payer: Self-pay | Admitting: Family Medicine

## 2021-12-26 DIAGNOSIS — F419 Anxiety disorder, unspecified: Secondary | ICD-10-CM

## 2021-12-27 NOTE — Telephone Encounter (Signed)
Requested medication (s) are due for refill today:   Yes  Requested medication (s) are on the active medication list:   Yes  Future visit scheduled:   No   Last ordered: 11/29/2021 #30, 0 refills  Returned because lab is needed per protocol.      Requested Prescriptions  Pending Prescriptions Disp Refills   hydrOXYzine (VISTARIL) 25 MG capsule [Pharmacy Med Name: HYDROXYZINE PAM 25 MG CAP[*]] 30 capsule 0    Sig: TAKE ONE CAPSULE BY MOUTH EVERY 8 HOURS AS NEEDED     Ear, Nose, and Throat:  Antihistamines 2 Failed - 12/26/2021  6:09 PM      Failed - Cr in normal range and within 360 days    Creatinine  Date Value Ref Range Status  06/12/2012 0.69 0.60 - 1.30 mg/dL Final   Creatinine, Ser  Date Value Ref Range Status  03/31/2017 0.84 0.61 - 1.24 mg/dL Final         Passed - Valid encounter within last 12 months    Recent Outpatient Visits           2 months ago Anxiety and depression   [place] [facility] at [facility], Earley Abide, MD

## 2022-01-06 ENCOUNTER — Other Ambulatory Visit: Payer: Self-pay | Admitting: Family Medicine

## 2022-01-06 DIAGNOSIS — F32A Depression, unspecified: Secondary | ICD-10-CM

## 2022-01-06 NOTE — Telephone Encounter (Signed)
Requested medications are due for refill today.  yes  Requested medications are on the active medications list.  yes  Last refill. 10/26/2021  Future visit scheduled.   no  Notes to clinic.  Labs are expired. Sig needs update.    Requested Prescriptions  Pending Prescriptions Disp Refills   sertraline (ZOLOFT) 50 MG tablet [Pharmacy Med Name: SERTRALINE 50 MG TAB] 70 tablet 0    Sig: TAKE ONE-HALF TABLET BY MOUTH ONE TIME DAILY FOR 7 DAYS, THEN TAKE ONE TABLET BY MOUTH ONE TIME DAILY     Psychiatry:  Antidepressants - SSRI - sertraline Failed - 01/06/2022 11:23 AM      Failed - AST in normal range and within 360 days    AST  Date Value Ref Range Status  03/31/2017 20 15 - 41 U/L Final         Failed - ALT in normal range and within 360 days    ALT  Date Value Ref Range Status  03/31/2017 21 17 - 63 U/L Final         Passed - Completed PHQ-2 or PHQ-9 in the last 360 days      Passed - Valid encounter within last 6 months    Recent Outpatient Visits           2 months ago Anxiety and depression   [place] [facility] at [facility], Earley Abide, MD

## 2022-01-06 NOTE — Telephone Encounter (Signed)
Requested medication (s) are due for refill today: yes  Requested medication (s) are on the active medication list: yes  Last refill:  10/26/21  Future visit scheduled:no  Notes to clinic:  Unable to refill per protocol due to failed labs, no updated results. Update sig.     Requested Prescriptions  Pending Prescriptions Disp Refills   sertraline (ZOLOFT) 50 MG tablet [Pharmacy Med Name: SERTRALINE 50 MG TAB] 70 tablet 0    Sig: TAKE ONE-HALF TABLET BY MOUTH ONE TIME DAILY FOR 7 DAYS, THEN TAKE ONE TABLET BY MOUTH ONE TIME DAILY     Psychiatry:  Antidepressants - SSRI - sertraline Failed - 01/06/2022 11:23 AM      Failed - AST in normal range and within 360 days    AST  Date Value Ref Range Status  03/31/2017 20 15 - 41 U/L Final         Failed - ALT in normal range and within 360 days    ALT  Date Value Ref Range Status  03/31/2017 21 17 - 63 U/L Final         Passed - Completed PHQ-2 or PHQ-9 in the last 360 days      Passed - Valid encounter within last 6 months    Recent Outpatient Visits           2 months ago Anxiety and depression   [place] [facility] at [facility], Earley Abide, MD

## 2022-01-06 NOTE — Telephone Encounter (Signed)
Pt called in to follow up on refill request. Pt says that he is not out but fear running out of his medication.    Pharmacy:  [facility] [place], [place] [address] AT [address] Phone:  [phone]  Fax:  [phone]      Last ov: 10/26/21
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
## 2022-01-14 IMAGING — US US EXTREM  UP VENOUS*R*
1 series · 13 of 24 positions shown · non-contrast
Comparison: None.

CLINICAL DATA: Pain and swelling in the right upper extremity.



[Series 1: us venous img upper uni right (dvt) · portal-venous · 13 of 34 slices shown]
[im 1/34]
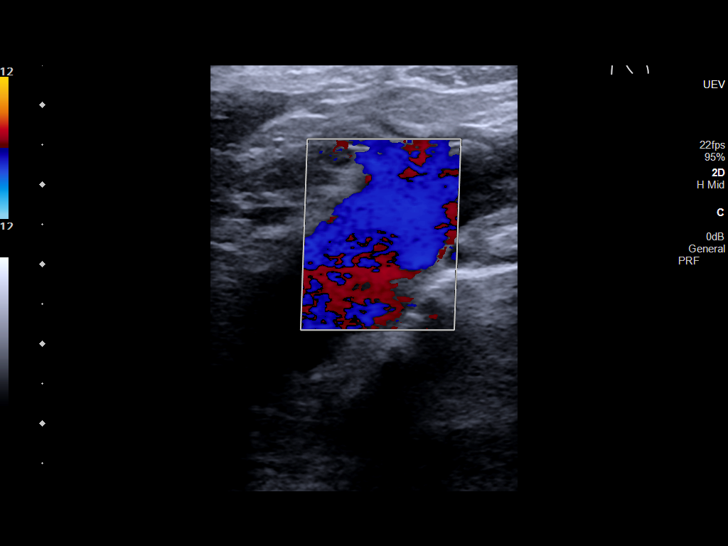
[im 3/34]
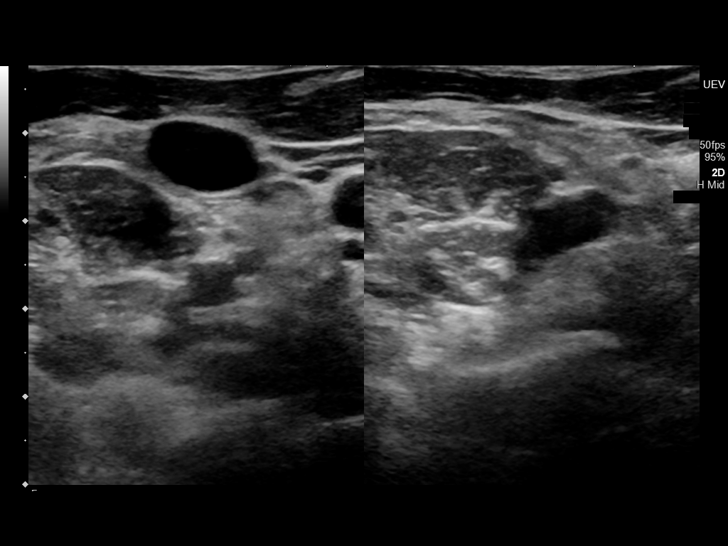
[im 6/34]
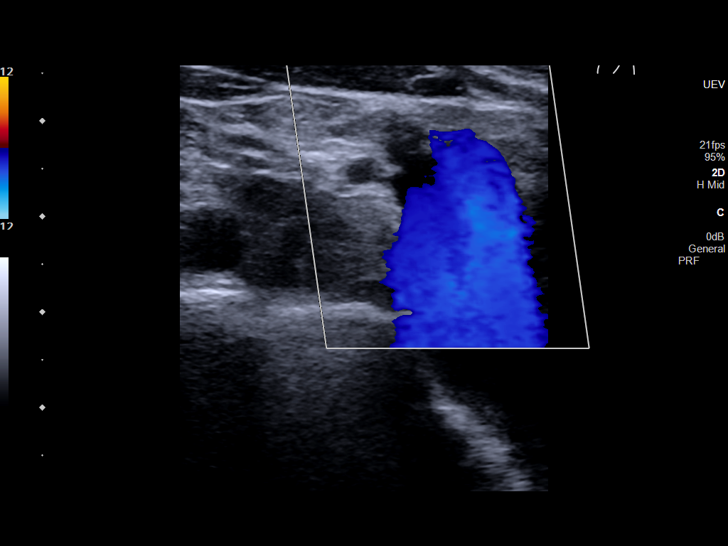
[im 9/34]
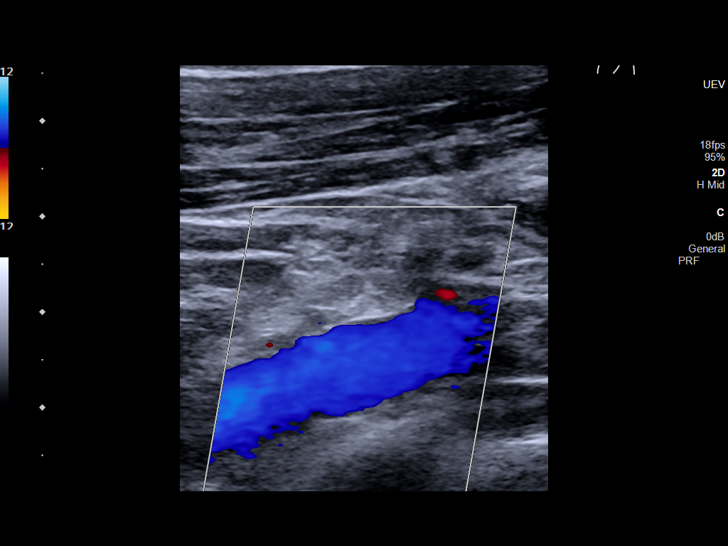
[im 12/34]
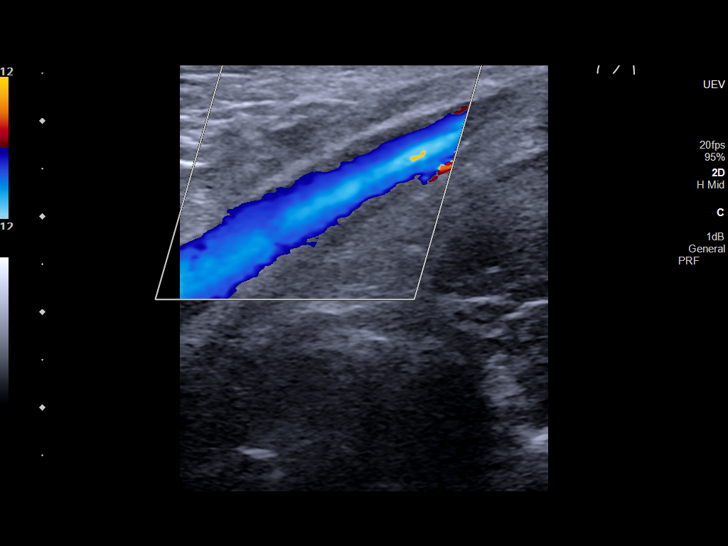
[im 15/34]
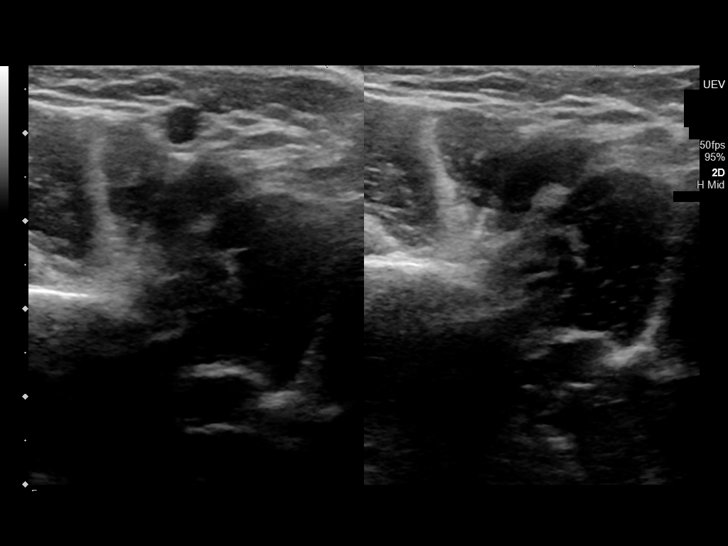
[im 18/34]
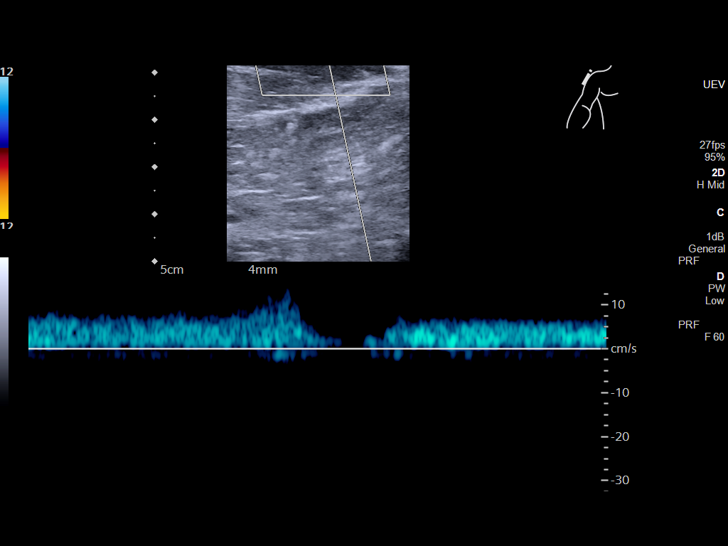
[im 19/34]
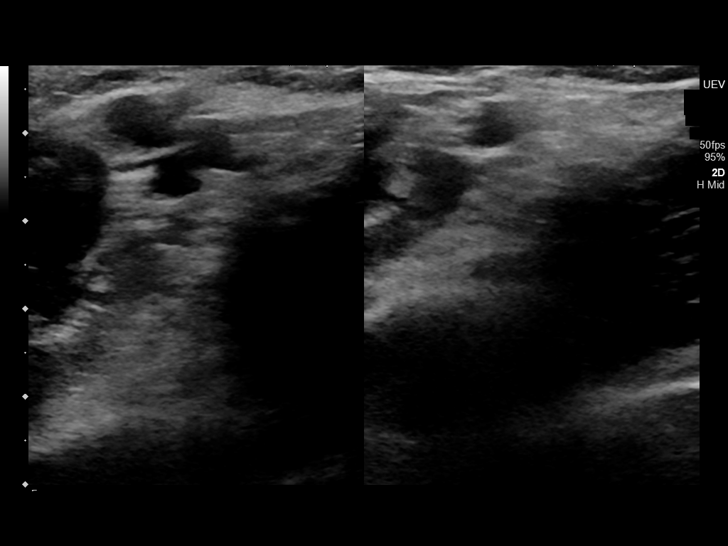
[im 22/34]
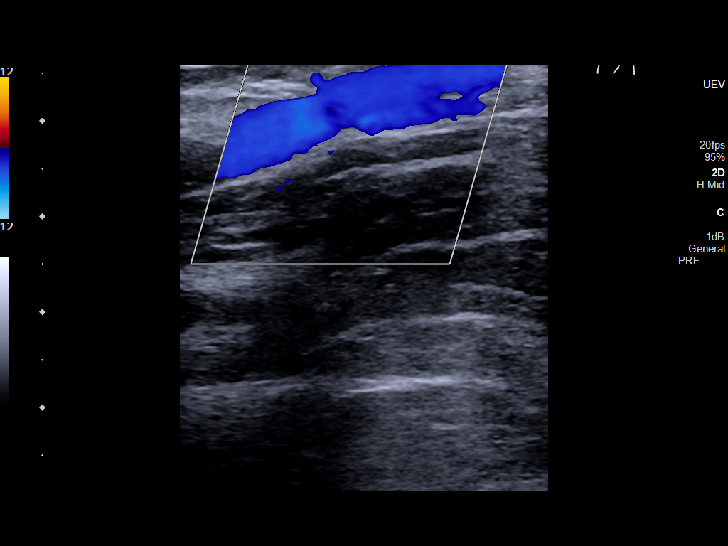
[im 25/34]
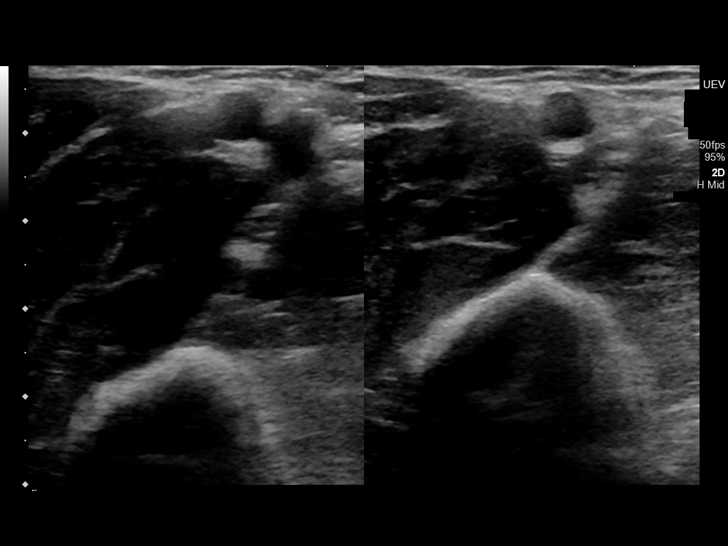
[im 28/34]
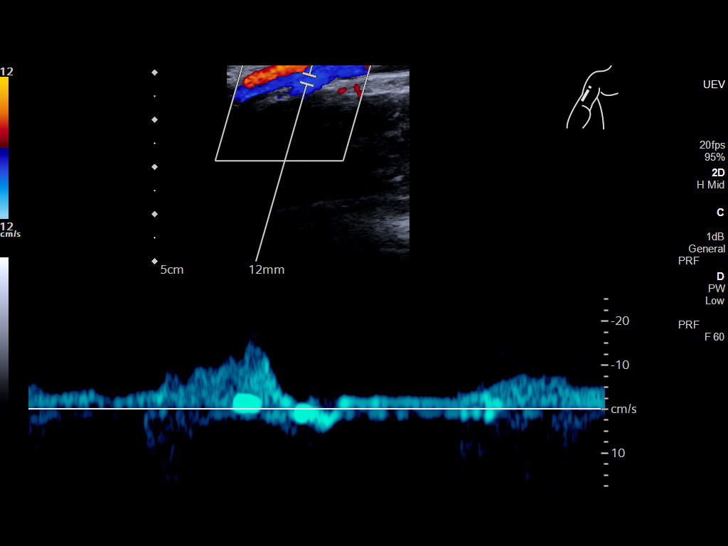
[im 31/34]
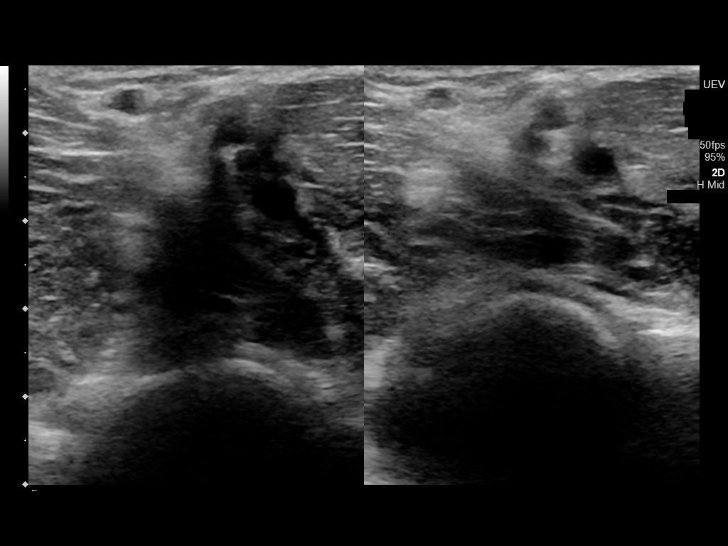
[im 34/34]
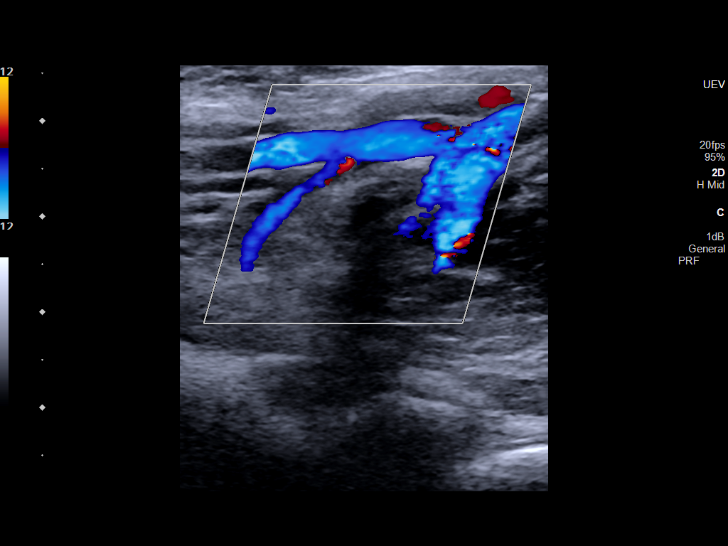

[13 of 24 positions shown; findings below may reference images not displayed]

FINDINGS: Contralateral Subclavian Vein: Respiratory phasicity is normal and
symmetric with the symptomatic side. No evidence of thrombus. Normal
compressibility.

Internal Jugular Vein: No evidence of thrombus. Normal
compressibility, respiratory phasicity and response to augmentation.

Subclavian Vein: No evidence of thrombus. Normal compressibility,
respiratory phasicity and response to augmentation.

Axillary Vein: No evidence of thrombus. Normal compressibility,
respiratory phasicity and response to augmentation.

Cephalic Vein: No evidence of thrombus. Normal compressibility,
respiratory phasicity and response to augmentation.

Basilic Vein: No evidence of thrombus. Normal compressibility,
respiratory phasicity and response to augmentation.

Brachial Veins: No evidence of thrombus. Normal compressibility,
respiratory phasicity and response to augmentation.

Radial Veins: No evidence of thrombus. Normal compressibility,
respiratory phasicity and response to augmentation.

Ulnar Veins: No evidence of thrombus. Normal compressibility,
respiratory phasicity and response to augmentation.
IMPRESSION: No evidence of DVT within the right upper extremity.

## 2022-01-25 ENCOUNTER — Telehealth: Payer: Self-pay | Admitting: Family Medicine

## 2022-01-25 ENCOUNTER — Other Ambulatory Visit: Payer: Self-pay

## 2022-01-25 DIAGNOSIS — F32A Depression, unspecified: Secondary | ICD-10-CM

## 2022-01-25 DIAGNOSIS — F419 Anxiety disorder, unspecified: Secondary | ICD-10-CM

## 2022-01-25 MED ORDER — SERTRALINE HCL 50 MG PO TABS: ORAL_TABLET | ORAL | 0 refills | Status: DC

## 2022-01-25 NOTE — Telephone Encounter (Signed)
Sent to [facility].  KP

## 2022-01-25 NOTE — Telephone Encounter (Signed)
Requested Prescriptions  Pending Prescriptions Disp Refills  . sertraline (ZOLOFT) 50 MG tablet [Pharmacy Med Name: SERTRALINE HCL 50 MG TABLET] 90 tablet 0    Sig: TAKE 0.5 TABLETS (25 MG TOTAL) BY MOUTH DAILY FOR 7 DAYS, THEN 1 TABLET (50 MG TOTAL) DAILY.     Psychiatry:  Antidepressants - SSRI - sertraline Failed - 01/25/2022  1:47 AM      Failed - AST in normal range and within 360 days    AST  Date Value Ref Range Status  03/31/2017 20 15 - 41 U/L Final         Failed - ALT in normal range and within 360 days    ALT  Date Value Ref Range Status  03/31/2017 21 17 - 63 U/L Final         Passed - Completed PHQ-2 or PHQ-9 in the last 360 days      Passed - Valid encounter within last 6 months    Recent Outpatient Visits          3 months ago Anxiety and depression   [place] [facility] at [facility], Earley Abide, MD

## 2022-01-25 NOTE — Telephone Encounter (Signed)
Pt called back to request a transfer to a different pharmacy  [facility] [place], [place] [address] [facility] AT [address]  [place] [address]  Phone: [phone] Fax: [phone]     He says his Rx is much cheaper here

## 2022-01-30 ENCOUNTER — Other Ambulatory Visit: Payer: Self-pay | Admitting: Family Medicine

## 2022-01-30 DIAGNOSIS — F32A Depression, unspecified: Secondary | ICD-10-CM

## 2022-01-31 NOTE — Telephone Encounter (Signed)
Requested Prescriptions  Pending Prescriptions Disp Refills   hydrOXYzine (VISTARIL) 25 MG capsule [Pharmacy Med Name: HYDROXYZINE PAM 25 MG CAP[*]] 90 capsule 0    Sig: TAKE ONE CAPSULE BY MOUTH EVERY 8 HOURS AS NEEDED     Ear, Nose, and Throat:  Antihistamines 2 Failed - 01/30/2022 11:30 AM      Failed - Cr in normal range and within 360 days    Creatinine  Date Value Ref Range Status  06/12/2012 0.69 0.60 - 1.30 mg/dL Final   Creatinine, Ser  Date Value Ref Range Status  03/31/2017 0.84 0.61 - 1.24 mg/dL Final         Passed - Valid encounter within last 12 months    Recent Outpatient Visits           3 months ago Anxiety and depression   [place] [facility] at [facility], Earley Abide, MD

## 2022-04-12 ENCOUNTER — Telehealth: Payer: Self-pay | Admitting: Family Medicine

## 2022-04-12 DIAGNOSIS — F32A Depression, unspecified: Secondary | ICD-10-CM

## 2022-04-12 NOTE — Telephone Encounter (Signed)
Requested medication (s) are due for refill today: no- sertraline only  Requested medication (s) are on the active medication list: yes  Last refill:  hydroxyzine: 01/31/22 #90            sertraline: 01/25/22 #90  Future visit scheduled: no  Notes to clinic:  overdue lab work   Requested Prescriptions  Pending Prescriptions Disp Refills   hydrOXYzine (VISTARIL) 25 MG capsule [Pharmacy Med Name: HYDROXYZINE PAM 25 MG CAP[*]] 90 capsule 0    Sig: TAKE ONE CAPSULE BY MOUTH EVERY 8 HOURS AS NEEDED     Ear, Nose, and Throat:  Antihistamines 2 Failed - 04/12/2022 10:51 AM      Failed - Cr in normal range and within 360 days    Creatinine  Date Value Ref Range Status  06/12/2012 0.69 0.60 - 1.30 mg/dL Final   Creatinine, Ser  Date Value Ref Range Status  03/31/2017 0.84 0.61 - 1.24 mg/dL Final         Passed - Valid encounter within last 12 months    Recent Outpatient Visits           5 months ago Anxiety and depression   [place] [facility] at [facility], Earley Abide, MD               sertraline (ZOLOFT) 50 MG tablet [Pharmacy Med Name: SERTRALINE 50 MG TAB[*]] 90 tablet 0    Sig: TAKE ONE-HALF TABLET BY MOUTH ONE TIME DAILY FOR 7 DAYS THEN TAKE ONE TABLET BY MOUTH ONE TIME DAILY     Psychiatry:  Antidepressants - SSRI - sertraline Failed - 04/12/2022 10:51 AM      Failed - AST in normal range and within 360 days    AST  Date Value Ref Range Status  03/31/2017 20 15 - 41 U/L Final         Failed - ALT in normal range and within 360 days    ALT  Date Value Ref Range Status  03/31/2017 21 17 - 63 U/L Final         Passed - Completed PHQ-2 or PHQ-9 in the last 360 days      Passed - Valid encounter within last 6 months    Recent Outpatient Visits           5 months ago Anxiety and depression   [place] [facility] at [facility], Earley Abide, MD

## 2022-04-13 NOTE — Telephone Encounter (Signed)
Refill? Pt still has no insurance to go to [facility]

## 2022-04-13 NOTE — Telephone Encounter (Signed)
PC no answer, send patient message on Mychart

## 2022-04-19 ENCOUNTER — Ambulatory Visit: Payer: Self-pay

## 2022-04-19 ENCOUNTER — Other Ambulatory Visit: Payer: Self-pay

## 2022-04-19 DIAGNOSIS — F419 Anxiety disorder, unspecified: Secondary | ICD-10-CM

## 2022-04-19 DIAGNOSIS — F32A Depression, unspecified: Secondary | ICD-10-CM

## 2022-04-19 MED ORDER — SERTRALINE HCL 50 MG PO TABS: ORAL_TABLET | ORAL | 0 refills | Status: DC

## 2022-04-19 MED ORDER — HYDROXYZINE PAMOATE 25 MG PO CAPS: ORAL_CAPSULE | ORAL | 0 refills | Status: DC

## 2022-04-19 NOTE — Telephone Encounter (Signed)
RX sent in for 45 days until appointment

## 2022-04-19 NOTE — Telephone Encounter (Signed)
Pt called in to advise that yes, he is still taking his medication and needs a refills, but he doesn't think the medication is strong enough or may need an alternative because he is still having anxiety. Please see TE from NT 04/19/2022.  Stated his insurance goes into effect on March 2nd, scheduled for first availble as CPE for 05/29/2022. Pt mentioned that if he needed to come in, he would, but he would like to wait until his insurance kicks in if possible.  Please advise.

## 2022-04-19 NOTE — Telephone Encounter (Signed)
Patient states he uses [facility] now as [facility] was too expensive
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
## 2022-04-21 ENCOUNTER — Ambulatory Visit (INDEPENDENT_AMBULATORY_CARE_PROVIDER_SITE_OTHER): Payer: Self-pay | Admitting: Family Medicine

## 2022-04-21 ENCOUNTER — Encounter: Payer: Self-pay | Admitting: Family Medicine

## 2022-04-21 VITALS — BP 136/82 | HR 68 | Ht 68.0 in | Wt 164.0 lb

## 2022-04-21 DIAGNOSIS — F419 Anxiety disorder, unspecified: Secondary | ICD-10-CM

## 2022-04-21 DIAGNOSIS — F32A Depression, unspecified: Secondary | ICD-10-CM

## 2022-04-21 MED ORDER — HYDROXYZINE HCL 10 MG PO TABS: 10.0000 mg | ORAL_TABLET | Freq: Three times a day (TID) | ORAL | 2 refills | Status: DC | PRN

## 2022-04-21 MED ORDER — TRAZODONE HCL 50 MG PO TABS
25.0000 mg | ORAL_TABLET | Freq: Every evening | ORAL | 3 refills | Status: DC | PRN
Start: 2022-04-21 — End: 2022-05-29

## 2022-04-21 MED ORDER — SERTRALINE HCL 100 MG PO TABS: ORAL_TABLET | ORAL | 0 refills | Status: DC

## 2022-04-21 NOTE — Progress Notes (Signed)
     Primary Care / Sports Medicine Office Visit  Patient Information:  Patient ID: WATT GEILER, male DOB: 1986-01-05 Age: 37 y.o. MRN: [id number]   Shawn Murray is a pleasant 37 y.o. male presenting with the following:  Chief Complaint  Patient presents with   Anxiety and depression    Unable to sleep at night    Vitals:   04/21/22 1427  BP: 136/82  Pulse: 68  SpO2: 98%   Vitals:   04/21/22 1427  Weight: 164 lb (74.4 kg)  Height: 5\' 8"  (1.727 m)   Body mass index is 24.94 kg/m.  No results found.   Independent interpretation of notes and tests performed by another provider:   None  Procedures performed:   None  Pertinent History, Exam, Impression, and Recommendations:   Shawn Murray was seen today for anxiety and depression.  Anxiety and depression Overview:    Assessment & Plan: Has been tolerating sertraline 50 mg well without adverse effects, has noted improvement in his appetite, mood, decreased irritability however still high PHQ and GAD numbers.  Sleep is still a major concern.  Given his tolerance of current regimen we will further titrate this from sertraline 50 mg daily to sertraline 100 mg daily.  Additionally as he is noting benefit from 25 mg hydroxyzine in the a.m., he would like to take this midday but it is too sedating.  As such, 10 mg dose with broad dosage range prescribed for as needed usage.  Lastly, to aid in sleep trazodone nightly as needed prescribed.  He is facing some insurance related barriers that should be resolved over the next few months, a referral to psychiatry has been placed for further management of his concerns, we will coordinate this to line up when his insurance issues are addressed.  I have encouraged the patient to contact us for any issues between now and then via MyChart or in person.  Orders: -     Ambulatory referral to Psychiatry -     hydrOXYzine HCl; Take 1-5 tablets (10-50 mg total) by mouth 3 (three) times daily as  needed for anxiety.  Dispense: 90 tablet; Refill: 2 -     Sertraline HCl; Take 1 tablet PO QD  Dispense: 90 tablet; Refill: 0 -     traZODone HCl; Take 0.5-1 tablets (25-50 mg total) by mouth at bedtime as needed for sleep.  Dispense: 30 tablet; Refill: 3     Orders & Medications Meds ordered this encounter  Medications   hydrOXYzine (ATARAX) 10 MG tablet    Sig: Take 1-5 tablets (10-50 mg total) by mouth 3 (three) times daily as needed for anxiety.    Dispense:  90 tablet    Refill:  2   sertraline (ZOLOFT) 100 MG tablet    Sig: Take 1 tablet PO QD    Dispense:  90 tablet    Refill:  0    No more refills until appointment   traZODone (DESYREL) 50 MG tablet    Sig: Take 0.5-1 tablets (25-50 mg total) by mouth at bedtime as needed for sleep.    Dispense:  30 tablet    Refill:  3   Orders Placed This Encounter  Procedures   Ambulatory referral to Psychiatry     No follow-ups on file.     Montel Culver, MD, [facility]   Primary Care Sports Medicine Primary Care and Sports Medicine at [facility]

## 2022-04-21 NOTE — Patient Instructions (Signed)
-  Take new sertraline 100 mg dose daily - Can take hydroxyzine 10-50 mg up to 3 times a day on as-needed basis (find the lowest dose needed throughout the day to maintain symptom control) - Can dose nightly trazodone, 25-50 mg for sleep - Referral coordinator will contact you to schedule visit with psychiatry - Contact our office for any questions/concerns and maintain follow-up as scheduled

## 2022-04-21 NOTE — Assessment & Plan Note (Signed)
Has been tolerating sertraline 50 mg well without adverse effects, has noted improvement in his appetite, mood, decreased irritability however still high PHQ and GAD numbers.  Sleep is still a major concern.  Given his tolerance of current regimen we will further titrate this from sertraline 50 mg daily to sertraline 100 mg daily.  Additionally as he is noting benefit from 25 mg hydroxyzine in the a.m., he would like to take this midday but it is too sedating.  As such, 10 mg dose with broad dosage range prescribed for as needed usage.  Lastly, to aid in sleep trazodone nightly as needed prescribed.  He is facing some insurance related barriers that should be resolved over the next few months, a referral to psychiatry has been placed for further management of his concerns, we will coordinate this to line up when his insurance issues are addressed.  I have encouraged the patient to contact us for any issues between now and then via MyChart or in person.

## 2022-05-15 ENCOUNTER — Ambulatory Visit
Admission: EM | Admit: 2022-05-15 | Discharge: 2022-05-15 | Disposition: A | Discharge status: Home / Self Care | Payer: Self-pay | Attending: Family Medicine | Admitting: Family Medicine

## 2022-05-15 ENCOUNTER — Encounter: Payer: Self-pay | Admitting: Emergency Medicine

## 2022-05-15 DIAGNOSIS — Z1152 Encounter for screening for COVID-19: Secondary | ICD-10-CM | POA: Insufficient documentation

## 2022-05-15 DIAGNOSIS — J069 Acute upper respiratory infection, unspecified: Secondary | ICD-10-CM | POA: Insufficient documentation

## 2022-05-15 LAB — RESP PANEL BY RT-PCR (RSV, FLU A&B, COVID)  RVPGX2
Influenza A by PCR: NEGATIVE
Influenza B by PCR: NEGATIVE
Resp Syncytial Virus by PCR: NEGATIVE
SARS Coronavirus 2 by RT PCR: NEGATIVE

## 2022-05-15 LAB — GROUP A STREP BY PCR: Group A Strep by PCR: NOT DETECTED

## 2022-05-15 MED ORDER — ONDANSETRON 8 MG PO TBDP: 8.0000 mg | ORAL_TABLET | Freq: Three times a day (TID) | ORAL | 0 refills | Status: DC | PRN

## 2022-05-15 MED ORDER — ONDANSETRON 4 MG PO TBDP
4.0000 mg | ORAL_TABLET | Freq: Once | ORAL | Status: AC
  Administered 2022-05-15: 4 mg via ORAL

## 2022-05-15 MED ORDER — ONDANSETRON HCL 8 MG PO TABS: 8.0000 mg | ORAL_TABLET | Freq: Three times a day (TID) | ORAL | 0 refills | Status: DC | PRN

## 2022-05-15 NOTE — ED Provider Notes (Signed)
[facility]    CSN: [id number] Arrival date & time: 05/15/22  0827      History   Chief Complaint Chief Complaint  Patient presents with   Nasal Congestion   Sore Throat   Cough   Wheezing    HPI Shawn Murray is a 37 y.o. male.   HPI   Shawn Murray presents for nasal congestion, headache, sore throat. Has painful swallowing. Endorses slight cough. Has "sick taste" in his mouth.  Has some upset stomach. Has nausea but no vomiting. Has some diarrhea. No fever. He is eating and drinking well. He feels like "crap." No medications prior to arrival.    Past Medical History:  Diagnosis Date   Anxiety    Depression    Heart murmur     Patient Active Problem List   Diagnosis Date Noted   Adjustment disorder 04/01/2017   Anxiety and depression 04/01/2017   PTSD (post-traumatic stress disorder) 04/01/2017   Suicidal ideation 04/01/2017    Past Surgical History:  Procedure Laterality Date   [place] Medications    Prior to Admission medications   Medication Sig Start Date End Date Taking? Authorizing Provider  ondansetron (ZOFRAN) 8 MG tablet Take 1 tablet (8 mg total) by mouth every 8 (eight) hours as needed for nausea or vomiting. 05/15/22  Yes Wynema Garoutte, DO  hydrOXYzine (ATARAX) 10 MG tablet Take 1-5 tablets (10-50 mg total) by mouth 3 (three) times daily as needed for anxiety. 04/21/22   Montel Culver, MD  sertraline (ZOLOFT) 100 MG tablet Take 1 tablet PO QD 04/21/22   Montel Culver, MD  traZODone (DESYREL) 50 MG tablet Take 0.5-1 tablets (25-50 mg total) by mouth at bedtime as needed for sleep. 04/21/22   Montel Culver, MD  famotidine (PEPCID) 20 MG tablet Take 1 tablet (20 mg total) by mouth 2 (two) times daily. 12/01/19 04/03/20  Darr, Edison Nasuti, PA-C    Family History Family History  Problem Relation Age of Onset   Lupus Mother    Asthma Mother    COPD Mother    Diabetes Father     Hypertension Father     Social History Social History   Tobacco Use   Smoking status: Former   Smokeless tobacco: Never  Scientific laboratory technician Use: Never used  Substance Use Topics   Alcohol use: Not Currently   Drug use: Yes    Types: Marijuana     Allergies   Sulfa antibiotics and Sulfabenzamide   Review of Systems Review of Systems: negative unless otherwise stated in HPI.      Physical Exam Triage Vital Signs ED Triage Vitals  Enc Vitals Group     BP 05/15/22 0917 128/69     Pulse Rate 05/15/22 0917 (!) 59     Resp 05/15/22 0917 18     Temp 05/15/22 0917 98.2 F (36.8 C)     Temp Source 05/15/22 0917 Oral     SpO2 05/15/22 0917 98 %     Weight --      Height --      Head Circumference --      Peak Flow --      Pain Score 05/15/22 0916 0     Pain Loc --      Pain Edu? --      Excl. in [place]? --  No data found.  Updated Vital Signs BP 128/69 (BP Location: Right Arm)   Pulse (!) 59   Temp 98.2 F (36.8 C) (Oral)   Resp 18   SpO2 98%   Visual Acuity Right Eye Distance:   Left Eye Distance:   Bilateral Distance:    Right Eye Near:   Left Eye Near:    Bilateral Near:     Physical Exam GEN:     alert, non-toxic appearing male in no distress    HENT:  mucus membranes moist, oropharyngeal without lesions or exudate, no tonsillar hypertrophy,  mild oropharyngeal erythema ,  moderate erythematous edematous turbinates, clear nasal discharge, bilateral TM normal EYES:   pupils equal and reactive, no scleral injection or discharge NECK:  normal ROM, no lymphadenopathy, no meningismus   RESP:  no increased work of breathing, clear to auscultation bilaterally CVS:   regular rate and rhythm Skin:   warm and dry, no rash on visible skin    UC Treatments / Results  Labs (all labs ordered are listed, but only abnormal results are displayed) Labs Reviewed  GROUP A STREP BY PCR  RESP PANEL BY RT-PCR (RSV, FLU A&B, COVID)  [id number]     EKG   Radiology No results found.  Procedures Procedures (including critical care time)  Medications Ordered in UC Medications  ondansetron (ZOFRAN-ODT) disintegrating tablet 4 mg (4 mg Oral Given 05/15/22 0953)  ondansetron (ZOFRAN-ODT) disintegrating tablet 4 mg (4 mg Oral Given 05/15/22 0953)    Initial Impression / Assessment and Plan / UC Course  I have reviewed the triage vital signs and the nursing notes.  Pertinent labs & imaging results that were available during my care of the patient were reviewed by me and considered in my medical decision making (see chart for details).       Pt is a 38 y.o. male who presents for 1-2 days of respiratory symptoms. [name] is afebrile here without recent antipyretics. Satting well on room air. Given Zofran for nausea with improvement. Overall pt is non-toxic appearing, well hydrated, without respiratory distress. Pulmonary exam is unremarkable.  COVID and influenza testing obtained and was negative. Strep PCR negative. History consistent with viral respiratory illness. Discussed symptomatic treatment.  Explained lack of efficacy of antibiotics in viral disease.  Typical duration of symptoms discussed.   Return and ED precautions given and voiced understanding. Discussed MDM, treatment plan and plan for follow-up with patient who agrees with plan.     Final Clinical Impressions(s) / UC Diagnoses   Final diagnoses:  Viral upper respiratory tract infection     Discharge Instructions      Your strep test is negative. We will contact you if your COVID/influenza test is positive.  Please quarantine while you wait for the results.  If your test is negative you may resume normal activities.  If your test is positive please continue to quarantine for at least 5 days from your symptom onset or until you are without a fever for at least 24 hours after the medications.    If your were prescribed medication, stop by the pharmacy to pick them  up.   You can take Tylenol and/or Ibuprofen as needed for fever reduction and pain relief.    For cough: honey 1/2 to 1 teaspoon (you can dilute the honey in water or another fluid).  You can also use guaifenesin and dextromethorphan for cough. You can use a humidifier for chest congestion and cough.  If you don't  have a humidifier, you can sit in the bathroom with the hot shower running.      For sore throat: try warm salt water gargles, Mucinex sore throat cough drops or cepacol lozenges, throat spray, warm tea or water with lemon/honey, popsicles or ice, or OTC cold relief medicine for throat discomfort. You can also purchase chloraseptic spray at the pharmacy or dollar store.   For congestion: take a daily anti-histamine like Zyrtec, Claritin, and a oral decongestant, such as pseudoephedrine.  You can also use Flonase 1-2 sprays in each nostril daily. Afrin is also a good option, if you do not have high blood pressure.    It is important to stay hydrated: drink plenty of fluids (water, gatorade/powerade/pedialyte, juices, or teas) to keep your throat moisturized and help further relieve irritation/discomfort.    Return or go to the Emergency Department if symptoms worsen or do not improve in the next few days      ED Prescriptions     Medication Sig Dispense Auth. Provider   ondansetron (ZOFRAN) 8 MG tablet Take 1 tablet (8 mg total) by mouth every 8 (eight) hours as needed for nausea or vomiting. 20 tablet Lyndee Hensen, DO      PDMP not reviewed this encounter.   Lyndee Hensen, DO 05/15/22 1128

## 2022-05-15 NOTE — Discharge Instructions (Signed)
Your strep test is negative. We will contact you if your COVID/influenza test is positive.  Please quarantine while you wait for the results.  If your test is negative you may resume normal activities.  If your test is positive please continue to quarantine for at least 5 days from your symptom onset or until you are without a fever for at least 24 hours after the medications.    If your were prescribed medication, stop by the pharmacy to pick them up.   You can take Tylenol and/or Ibuprofen as needed for fever reduction and pain relief.    For cough: honey 1/2 to 1 teaspoon (you can dilute the honey in water or another fluid).  You can also use guaifenesin and dextromethorphan for cough. You can use a humidifier for chest congestion and cough.  If you don't have a humidifier, you can sit in the bathroom with the hot shower running.      For sore throat: try warm salt water gargles, Mucinex sore throat cough drops or cepacol lozenges, throat spray, warm tea or water with lemon/honey, popsicles or ice, or OTC cold relief medicine for throat discomfort. You can also purchase chloraseptic spray at the pharmacy or dollar store.   For congestion: take a daily anti-histamine like Zyrtec, Claritin, and a oral decongestant, such as pseudoephedrine.  You can also use Flonase 1-2 sprays in each nostril daily. Afrin is also a good option, if you do not have high blood pressure.    It is important to stay hydrated: drink plenty of fluids (water, gatorade/powerade/pedialyte, juices, or teas) to keep your throat moisturized and help further relieve irritation/discomfort.    Return or go to the Emergency Department if symptoms worsen or do not improve in the next few days

## 2022-05-15 NOTE — ED Triage Notes (Signed)
Pt presents with a cough, runny nose, wheezing, sore throat and sinus pressure since yesterday.

## 2022-05-29 ENCOUNTER — Ambulatory Visit (INDEPENDENT_AMBULATORY_CARE_PROVIDER_SITE_OTHER): Payer: 59 | Admitting: Family Medicine

## 2022-05-29 ENCOUNTER — Encounter: Payer: Self-pay | Admitting: Family Medicine

## 2022-05-29 VITALS — BP 118/80 | HR 66 | Ht 68.0 in | Wt 167.0 lb

## 2022-05-29 DIAGNOSIS — E559 Vitamin D deficiency, unspecified: Secondary | ICD-10-CM | POA: Diagnosis not present

## 2022-05-29 DIAGNOSIS — Z1159 Encounter for screening for other viral diseases: Secondary | ICD-10-CM

## 2022-05-29 DIAGNOSIS — M6530 Trigger finger, unspecified finger: Secondary | ICD-10-CM

## 2022-05-29 DIAGNOSIS — Z13220 Encounter for screening for lipoid disorders: Secondary | ICD-10-CM

## 2022-05-29 DIAGNOSIS — Z0000 Encounter for general adult medical examination without abnormal findings: Secondary | ICD-10-CM

## 2022-05-29 DIAGNOSIS — F32A Depression, unspecified: Secondary | ICD-10-CM

## 2022-05-29 DIAGNOSIS — F419 Anxiety disorder, unspecified: Secondary | ICD-10-CM

## 2022-05-29 DIAGNOSIS — Z114 Encounter for screening for human immunodeficiency virus [HIV]: Secondary | ICD-10-CM

## 2022-05-29 MED ORDER — TRAZODONE HCL 50 MG PO TABS: 25.0000 mg | ORAL_TABLET | Freq: Every evening | ORAL | 2 refills | Status: DC | PRN

## 2022-05-29 MED ORDER — HYDROXYZINE HCL 10 MG PO TABS: 10.0000 mg | ORAL_TABLET | Freq: Three times a day (TID) | ORAL | 2 refills | Status: DC | PRN

## 2022-05-29 MED ORDER — SERTRALINE HCL 100 MG PO TABS: ORAL_TABLET | ORAL | 2 refills | Status: DC

## 2022-05-29 NOTE — Assessment & Plan Note (Signed)
Annual examination completed, risk stratification labs ordered, anticipatory guidance provided.  We will follow labs once resulted. 

## 2022-05-29 NOTE — Assessment & Plan Note (Signed)
Chronic condition affecting bilateral hands, atraumatic, no specific treatments to date.  Advised topical diclofenac, home-based exercises, follow-up sooner if still symptomatic at which point can consider escalation of pharmacotherapy, bracing, injections, formal PT/OT.

## 2022-05-29 NOTE — Progress Notes (Signed)
Annual Physical Exam Visit  Patient Information:  Patient ID: Shawn Murray, male DOB: 06-05-1985 Age: 37 y.o. MRN: [id number]   Subjective:   CC: Annual Physical Exam  HPI:  Shawn Murray is here for their annual physical.  I reviewed the past medical history, family history, social history, surgical history, and allergies today and changes were made as necessary.  Please see the problem list section below for additional details.  Past Medical History: Past Medical History:  Diagnosis Date   Anxiety    Depression    Heart murmur    Past Surgical History: Past Surgical History:  Procedure Laterality Date   ADENOIDECTOMY     HERNIA REPAIR     TONSILLECTOMY     Family History: Family History  Problem Relation Age of Onset   Lupus Mother    Asthma Mother    COPD Mother    Diabetes Father    Hypertension Father    Allergies: Allergies  Allergen Reactions   Sulfa Antibiotics Hives   Sulfabenzamide Hives   Health Maintenance: Health Maintenance  Topic Date Due   COVID-19 Vaccine (1) Never done   Hepatitis C Screening  Never done   INFLUENZA VACCINE  10/25/2021   DTaP/Tdap/Td (3 - Td or Tdap) 09/06/2031   HIV Screening  Completed   HPV VACCINES  Aged Out    HM Colonoscopy     This patient has no relevant Health Maintenance data.      Medications: Current Outpatient Medications on File Prior to Visit  Medication Sig Dispense Refill   [DISCONTINUED] famotidine (PEPCID) 20 MG tablet Take 1 tablet (20 mg total) by mouth 2 (two) times daily. 30 tablet 0   No current facility-administered medications on file prior to visit.    Review of Systems: No headache, visual changes, nausea, vomiting, diarrhea, constipation, dizziness, abdominal pain, skin rash, fevers, chills, night sweats, swollen lymph nodes, weight loss, chest pain, body aches, joint swelling, muscle aches, shortness of breath, mood changes, visual or auditory hallucinations  reported.  Objective:   Vitals:   05/29/22 1528  BP: 118/80  Pulse: 66  SpO2: 98%   Vitals:   05/29/22 1528  Weight: 167 lb (75.8 kg)  Height: '5\' 8"'$  (1.727 m)   Body mass index is 25.39 kg/m.  General: Well Developed, well nourished, and in no acute distress.  Neuro: Alert and oriented x3, extra-ocular muscles intact, sensation grossly intact. Cranial nerves II through XII are grossly intact, motor, sensory, and coordinative functions are intact. HEENT: Normocephalic, atraumatic, pupils equal round reactive to light, neck supple, no masses, no lymphadenopathy, thyroid nonpalpable. Oropharynx, nasopharynx, external ear canals are unremarkable. Skin: Warm and dry, no rashes noted.  Cardiac: Regular rate and rhythm, no murmurs rubs or gallops. No peripheral edema. Pulses symmetric. Respiratory: Clear to auscultation bilaterally. Not using accessory muscles, speaking in full sentences.  Abdominal: Soft, nontender, nondistended, positive bowel sounds, no masses, no organomegaly. Musculoskeletal: Shoulder, elbow, wrist, hip, knee, ankle stable, and with full range of motion.   Impression and Recommendations:   The patient was counselled, risk factors were discussed, and anticipatory guidance given.  Problem List Items Addressed This Visit       Musculoskeletal and Integument   Trigger finger    Chronic condition affecting bilateral hands, atraumatic, no specific treatments to date.  Advised topical diclofenac, home-based exercises, follow-up sooner if still symptomatic at which point can consider escalation of pharmacotherapy, bracing, injections, formal PT/OT.  Other   Anxiety and depression    Excellent interval reduction in PHQ and GAD scores, discussed the options of titration, augmentation, monitoring.  He is awaiting psychiatry evaluation, as such we will continue current regimen while he works on nonpharmacologic methods for symptom control.  Reevaluation in 3  months.      Relevant Medications   hydrOXYzine (ATARAX) 10 MG tablet   sertraline (ZOLOFT) 100 MG tablet   traZODone (DESYREL) 50 MG tablet   Annual physical exam - Primary    Annual examination completed, risk stratification labs ordered, anticipatory guidance provided.  We will follow labs once resulted.      Relevant Orders   CBC   Comprehensive metabolic panel   Hepatitis C antibody   HIV Antibody (routine testing w rflx)   Lipid panel   TSH   VITAMIN D 25 Hydroxy (Vit-D Deficiency, Fractures)   Other Visit Diagnoses     Vitamin D deficiency       Relevant Orders   VITAMIN D 25 Hydroxy (Vit-D Deficiency, Fractures)   Screening for lipoid disorders       Relevant Orders   Comprehensive metabolic panel   Lipid panel   Need for hepatitis C screening test       Relevant Orders   Hepatitis C antibody   Screening for HIV (human immunodeficiency virus)       Relevant Orders   HIV Antibody (routine testing w rflx)        Orders & Medications Medications:  Meds ordered this encounter  Medications   hydrOXYzine (ATARAX) 10 MG tablet    Sig: Take 1-5 tablets (10-50 mg total) by mouth 3 (three) times daily as needed for anxiety.    Dispense:  90 tablet    Refill:  2   sertraline (ZOLOFT) 100 MG tablet    Sig: Take 1 tablet PO QD    Dispense:  90 tablet    Refill:  2    No more refills until appointment   traZODone (DESYREL) 50 MG tablet    Sig: Take 0.5-1 tablets (25-50 mg total) by mouth at bedtime as needed for sleep.    Dispense:  90 tablet    Refill:  2   Orders Placed This Encounter  Procedures   CBC   Comprehensive metabolic panel   Hepatitis C antibody   HIV Antibody (routine testing w rflx)   Lipid panel   TSH   VITAMIN D 25 Hydroxy (Vit-D Deficiency, Fractures)     Return in about 3 months (around 08/29/2022) for Follow-up hand/mood.    Montel Culver, MD, [facility]   Primary Care Sports Medicine Primary Care and Sports Medicine at [facility]

## 2022-05-29 NOTE — Assessment & Plan Note (Signed)
Excellent interval reduction in PHQ and GAD scores, discussed the options of titration, augmentation, monitoring.  He is awaiting psychiatry evaluation, as such we will continue current regimen while he works on nonpharmacologic methods for symptom control.  Reevaluation in 3 months.

## 2022-05-29 NOTE — Patient Instructions (Addendum)
-   Obtain fasting labs with orders provided (can have water or black coffee but otherwise no food or drink x 8 hours before labs) - Review information provided - Attend eye doctor annually, dentist every 6 months, work towards or maintain 30 minutes of moderate intensity physical activity at least 5 days per week, and consume a balanced diet - Return in 3 months - Contact us for any questions between now and then  Additionally: - Use Voltaren gel (diclofenac 1%) 2-4 times per day - Start home exercise with information provided

## 2022-06-16 LAB — CBC
Hematocrit: 46.1 % (ref 37.5–51.0)
Hemoglobin: 16.1 g/dL (ref 13.0–17.7)
MCH: 30.6 pg (ref 26.6–33.0)
MCHC: 34.9 g/dL (ref 31.5–35.7)
MCV: 88 fL (ref 79–97)
Platelets: 247 10*3/uL (ref 150–450)
RBC: 5.26 x10E6/uL (ref 4.14–5.80)
RDW: 12.8 % (ref 11.6–15.4)
WBC: 8.8 10*3/uL (ref 3.4–10.8)

## 2022-06-16 LAB — COMPREHENSIVE METABOLIC PANEL
ALT: 44 IU/L (ref 0–44)
AST: 22 IU/L (ref 0–40)
Albumin/Globulin Ratio: 2.4 — ABNORMAL HIGH (ref 1.2–2.2)
Albumin: 4.6 g/dL (ref 4.1–5.1)
Alkaline Phosphatase: 80 IU/L (ref 44–121)
BUN/Creatinine Ratio: 11 (ref 9–20)
BUN: 9 mg/dL (ref 6–20)
Bilirubin Total: 0.4 mg/dL (ref 0.0–1.2)
CO2: 20 mmol/L (ref 20–29)
Calcium: 9.2 mg/dL (ref 8.7–10.2)
Chloride: 105 mmol/L (ref 96–106)
Creatinine, Ser: 0.82 mg/dL (ref 0.76–1.27)
Globulin, Total: 1.9 g/dL (ref 1.5–4.5)
Glucose: 117 mg/dL — ABNORMAL HIGH (ref 70–99)
Potassium: 4 mmol/L (ref 3.5–5.2)
Sodium: 140 mmol/L (ref 134–144)
Total Protein: 6.5 g/dL (ref 6.0–8.5)
eGFR: 118 mL/min/{1.73_m2} (ref >59)

## 2022-06-16 LAB — LIPID PANEL
Chol/HDL Ratio: 3.4 ratio (ref 0.0–5.0)
Cholesterol, Total: 113 mg/dL (ref 100–199)
HDL: 33 mg/dL — ABNORMAL LOW (ref >39)
LDL Chol Calc (NIH): 65 mg/dL (ref 0–99)
Triglycerides: 69 mg/dL (ref 0–149)
VLDL Cholesterol Cal: 15 mg/dL (ref 5–40)

## 2022-06-16 LAB — VITAMIN D 25 HYDROXY (VIT D DEFICIENCY, FRACTURES): Vit D, 25-Hydroxy: 18.6 ng/mL — ABNORMAL LOW (ref 30.0–100.0)

## 2022-06-16 LAB — HEPATITIS C ANTIBODY: Hep C Virus Ab: NONREACTIVE

## 2022-06-16 LAB — HIV ANTIBODY (ROUTINE TESTING W REFLEX): HIV Screen 4th Generation wRfx: NONREACTIVE

## 2022-06-16 LAB — TSH: TSH: 0.942 u[IU]/mL (ref 0.450–4.500)

## 2022-06-23 ENCOUNTER — Other Ambulatory Visit: Payer: Self-pay | Admitting: Family Medicine

## 2022-06-23 MED ORDER — VITAMIN D (ERGOCALCIFEROL) 1.25 MG (50000 UNIT) PO CAPS: 50000.0000 [IU] | ORAL_CAPSULE | ORAL | 0 refills | Status: DC

## 2022-08-03 ENCOUNTER — Ambulatory Visit: Payer: 59 | Admitting: Psychiatry

## 2022-08-03 ENCOUNTER — Encounter: Payer: Self-pay | Admitting: Psychiatry

## 2022-08-03 VITALS — BP 126/74 | HR 66 | Temp 97.5°F | Ht 68.0 in | Wt 164.8 lb

## 2022-08-03 DIAGNOSIS — F1011 Alcohol abuse, in remission: Secondary | ICD-10-CM | POA: Insufficient documentation

## 2022-08-03 DIAGNOSIS — F1220 Cannabis dependence, uncomplicated: Secondary | ICD-10-CM

## 2022-08-03 DIAGNOSIS — F1121 Opioid dependence, in remission: Secondary | ICD-10-CM

## 2022-08-03 DIAGNOSIS — Z8659 Personal history of other mental and behavioral disorders: Secondary | ICD-10-CM | POA: Insufficient documentation

## 2022-08-03 DIAGNOSIS — F439 Reaction to severe stress, unspecified: Secondary | ICD-10-CM | POA: Diagnosis not present

## 2022-08-03 DIAGNOSIS — F39 Unspecified mood [affective] disorder: Secondary | ICD-10-CM

## 2022-08-03 DIAGNOSIS — Z79899 Other long term (current) drug therapy: Secondary | ICD-10-CM

## 2022-08-03 MED ORDER — DIVALPROEX SODIUM ER 250 MG PO TB24: 750.0000 mg | ORAL_TABLET | Freq: Every day | ORAL | 1 refills | Status: DC
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
## 2022-08-03 NOTE — Patient Instructions (Signed)
Divalproex Sodium Delayed- or Extended-Release Tablets What is this medication? DIVALPROEX SODIUM (dye VAL pro ex SO dee um) prevents and controls seizures in people with epilepsy. It may also be used to prevent migraine headaches. It can also be used to treat bipolar disorder. It works by calming overactive nerves in your body. This medicine may be used for other purposes; ask your health care provider or pharmacist if you have questions. COMMON BRAND NAME(S): Depakote, Depakote ER What should I tell my care team before I take this medication? They need to know if you have any of these conditions: Frequently drink alcohol Kidney disease Liver disease Low platelet counts Mitochondrial disease Suicidal thoughts, plans, or attempt by you or a family member Urea cycle disorder (UCD) An unusual or allergic reaction to divalproex sodium, sodium valproate, valproic acid, other medications, foods, dyes, or preservatives Pregnant or trying to get pregnant Breast-feeding How should I use this medication? Take this medication by mouth with a drink of water. Follow the directions on the prescription label. Do not cut, crush or chew this medication. You can take it with or without food. If it upsets your stomach, take it with food. Take your medication at regular intervals. Do not take it more often than directed. Do not stop taking except on your care team's advice. A special MedGuide will be given to you by the pharmacist with each prescription and refill. Be sure to read this information carefully each time. Talk to your care team about the use of this medication in children. While this medication may be prescribed for children as young as 10 years for selected conditions, precautions do apply. Overdosage: If you think you have taken too much of this medicine contact a poison control center or emergency room at once. NOTE: This medicine is only for you. Do not share this medicine with others. What if I  miss a dose? If you miss a dose, take it as soon as you can. If it is almost time for your next dose, take only that dose. Do not take double or extra doses. What may interact with this medication? Do not take this medication with any of the following: Sodium phenylbutyrate This medication may also interact with the following: Aspirin Certain antibiotics, such as ertapenem, imipenem, meropenem Certain medications for depression, anxiety, or other mental health conditions Certain medications for seizures, such as cannabidiol, carbamazepine, clonazepam, diazepam, ethosuximide, felbamate, lamotrigine, phenobarbital, phenytoin, primidone, rufinamide, topiramate Certain medications that treat or prevent blood clots, such as warfarin Cholestyramine Estrogen and progestin hormones Methotrexate Propofol Rifampin Ritonavir Tolbutamide Zidovudine This list may not describe all possible interactions. Give your health care provider a list of all the medicines, herbs, non-prescription drugs, or dietary supplements you use. Also tell them if you smoke, drink alcohol, or use illegal drugs. Some items may interact with your medicine. What should I watch for while using this medication? Tell your care team if your symptoms do not get better or they start to get worse. This medication may cause serious skin reactions. They can happen weeks to months after starting the medication. Contact your care team right away if you notice fevers or flu-like symptoms with a rash. The rash may be red or purple and then turn into blisters or peeling of the skin. Or, you might notice a red rash with swelling of the face, lips or lymph nodes in your neck or under your arms. Wear a medical ID bracelet or chain, and carry a card that describes   your disease and details of your medication and dosage times. You may get drowsy, dizzy, or have blurred vision. Do not drive, use machinery, or do anything that needs mental alertness  until you know how this medication affects you. To reduce dizzy or fainting spells, do not sit or stand up quickly, especially if you are an older patient. Alcohol can increase drowsiness and dizziness. Avoid alcoholic drinks. This medication can make you more sensitive to the sun. Keep out of the sun. If you cannot avoid being in the sun, wear protective clothing and use sunscreen. Do not use sun lamps or tanning beds/booths. Patients and their families should watch out for new or worsening depression or thoughts of suicide. Also watch out for sudden changes in feelings such as feeling anxious, agitated, panicky, irritable, hostile, aggressive, impulsive, severely restless, overly excited and hyperactive, or not being able to sleep. If this happens, especially at the beginning of treatment or after a change in dose, call your care team. Women should inform their care team if they wish to become pregnant or think they might be pregnant. There is a potential for serious side effects to an unborn child. Talk to your care team or pharmacist for more information. Women who become pregnant while using this medication may enroll in the [place] Antiepileptic Drug Pregnancy Registry by calling 1-888-233-2334. This registry collects information about the safety of antiepileptic medication use during pregnancy. This medication may cause a decrease in folic acid and vitamin D. You should make sure that you get enough vitamins while you are taking this medication. Discuss the foods you eat and the vitamins you take with your care team. What side effects may I notice from receiving this medication? Side effects that you should report to your care team as soon as possible: Allergic reactions--skin rash, itching, hives, swelling of the face, lips, tongue, or throat High ammonia level--unusual weakness or fatigue, confusion, loss of appetite, nausea, vomiting, seizures Liver injury--right upper belly pain, loss of  appetite, nausea, light-colored stool, dark yellow or brown urine, yellowing skin or eyes, unusual weakness or fatigue Low body temperature, drowsiness, confusion Pancreatitis--severe stomach pain that spreads to your back or gets worse after eating or when touched, fever, nausea, vomiting Rash, fever, and swollen lymph nodes Thoughts of suicide or self-harm, worsening mood, feelings of depression Unusual bruising or bleeding Side effects that usually do not require medical attention (report to your care team if they continue or are bothersome): Change in vision Dizziness Drowsiness Hair loss Headache Nausea Tremors or shaking Weight gain This list may not describe all possible side effects. Call your doctor for medical advice about side effects. You may report side effects to FDA at 1-800-FDA-1088. Where should I keep my medication? Keep out of reach of children and pets. Store at room temperature between 15 and 30 degrees C (59 and 86 degrees F). Keep container tightly closed. Throw away any unused medication after the expiration date. NOTE: This sheet is a summary. It may not cover all possible information. If you have questions about this medicine, talk to your doctor, pharmacist, or health care provider.  2023 Elsevier/Gold Standard (2021-06-22 00:00:00)  

## 2022-08-16 ENCOUNTER — Ambulatory Visit (INDEPENDENT_AMBULATORY_CARE_PROVIDER_SITE_OTHER): Payer: 59

## 2022-08-16 ENCOUNTER — Ambulatory Visit
Admission: EM | Admit: 2022-08-16 | Discharge: 2022-08-16 | Disposition: A | Discharge status: Home / Self Care | Payer: 59 | Attending: Family Medicine | Admitting: Family Medicine

## 2022-08-16 DIAGNOSIS — M79632 Pain in left forearm: Secondary | ICD-10-CM

## 2022-08-16 DIAGNOSIS — M79642 Pain in left hand: Secondary | ICD-10-CM

## 2022-08-16 DIAGNOSIS — M25532 Pain in left wrist: Secondary | ICD-10-CM | POA: Diagnosis not present

## 2022-08-16 MED ORDER — KETOROLAC TROMETHAMINE 60 MG/2ML IM SOLN
30.0000 mg | Freq: Once | INTRAMUSCULAR | Status: AC
  Administered 2022-08-16: 30 mg via INTRAMUSCULAR

## 2022-08-16 MED ORDER — NAPROXEN 500 MG PO TABS: 500.0000 mg | ORAL_TABLET | Freq: Two times a day (BID) | ORAL | 0 refills | Status: DC

## 2022-08-16 NOTE — ED Provider Notes (Signed)
[facility]    CSN: [id number] Arrival date & time: 08/16/22  1310      History   Chief Complaint Chief Complaint  Patient presents with   Hand Pain   Wrist Pain    HPI  HPI Shawn Murray is a 37 y.o. male.   Shawn Murray presents for left hand and wrist pain for the past day.  Pain shoots up his left forearm towards his elbow.  He has not been lifting any heavy objects.  Denies injury or trauma to the area.  Pain is worse with touching the area.     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 Authorizing Provider  divalproex (DEPAKOTE ER) 250 MG 24 hr tablet Take 3 tablets (750 mg total) by mouth daily with supper. 08/03/22  Yes Jomarie Longs, MD  hydrOXYzine (ATARAX) 10 MG tablet Take 1-5 tablets (10-50 mg total) by mouth 3 (three) times daily as needed for anxiety. 05/29/22  Yes Jerrol Banana, MD  naproxen (NAPROSYN) 500 MG tablet Take 1 tablet (500 mg total) by mouth 2 (two) times daily with a meal. 08/16/22  Yes Jaquari Reckner, DO  sertraline (ZOLOFT) 100 MG tablet Take 1 tablet PO QD 05/29/22  Yes Jerrol Banana, MD  traZODone (DESYREL)  50 MG tablet Take 0.5-1 tablets (25-50 mg total) by mouth at bedtime as needed for sleep. 05/29/22  Yes Jerrol Banana, MD  Vitamin D, Ergocalciferol, (DRISDOL) 1.25 MG (50000 UNIT) CAPS capsule Take 1 capsule (50,000 Units total) by mouth every 7 (seven) days. Take for 8 total doses(weeks) 06/23/22  Yes Jerrol Banana, MD  famotidine (PEPCID) 20 MG tablet Take 1 tablet (20 mg total) by mouth 2 (two) times daily. 12/01/19 04/03/20  Darr, Gerilyn Pilgrim, PA-C    Family History Family History  Problem Relation Age of Onset   Alcohol abuse Mother    Lupus Mother    Asthma Mother    COPD Mother    Alcohol abuse Father    Diabetes Father    Hypertension Father    Alcohol abuse Maternal Uncle    ADD / ADHD Son     Social History Social History   Tobacco Use   Smoking status: Former   Smokeless tobacco: Never  Building services engineer Use: Never used  Substance Use Topics   Alcohol use: Not Currently    Comment: past heavy abuse   Drug use: Yes    Types: Marijuana    Comment: past history of opioid abuse     Allergies   Sulfa antibiotics and  Sulfabenzamide   Review of Systems Review of Systems: :negative unless otherwise stated in HPI.      Physical Exam Triage Vital Signs ED Triage Vitals  Enc Vitals Group     BP      Pulse      Resp      Temp      Temp src      SpO2      Weight      Height      Head Circumference      Peak Flow      Pain Score      Pain Loc      Pain Edu?      Excl. in GC?    No data found.  Updated Vital Signs BP 127/75 (BP Location: Right Arm)   Pulse (!) 56   Temp 98 F (36.7 C) (Oral)   Resp 16   Ht 5\' 8"  (1.727 m)   Wt 77.1 kg   SpO2 95%   BMI 25.85 kg/m   Visual Acuity Right Eye Distance:   Left Eye Distance:   Bilateral Distance:    Right Eye Near:   Left Eye Near:    Bilateral Near:     Physical Exam GEN: well appearing male in no acute distress  CVS: well perfused  RESP: speaking in full sentences without pause, no  respiratory distress  MSK: left hand and wrist  Hand: Inspection: No obvious deformity b/l. No swelling, erythema or bruising b/l Palpation: TTP left distal forearm and proximal 1st-4th metacarpals  No swelling in PIP, DIP joints b/l. Flexor digitorum profundus and superficialis tendon functions are intact.  PIP joint collateral ligaments are stable  Strength: 5/5 strength in the forearm, wrist and interosseus muscles b/l  Wrist, left: Inspection yielded no erythema, ecchymosis, bony deformity, or swelling. ROM full with good flexion and extension and ulnar/radial deviation that is symmetrical with opposite wrist. Palpation is normal over scaphoid; tendons without tenderness/swelling. Strength 5/5 in all directions without pain.   Neurovascular: NV intact b/l  UC Treatments / Results  Labs (all labs ordered are listed, but only abnormal results are displayed) Labs Reviewed - No data to display  EKG   Radiology DG Forearm Left  Result Date: 08/16/2022 CLINICAL DATA:  Left hand and wrist pain for 1 day. Pain radiates up forearm to elbow. No known injury. Clawtoe deformity. EXAM: LEFT FOREARM - 2 VIEW; LEFT HAND - COMPLETE 3+ VIEW COMPARISON:  Left wrist radiographs 05/30/2010, left elbow radiographs 05/30/2010 FINDINGS: Left hand: Normal bone mineralization. Neutral ulnar variance. Joint spaces are preserved. No acute fracture is seen. No dislocation. Left forearm: Normal bone mineralization. No acute fracture is seen within the radius or ulna. A well corticated 3 mm ossicle just distal to distal humeral lateral epicondyle is unchanged from 05/30/2010 and likely represents the sequela of remote trauma. IMPRESSION: No acute fracture within the left hand or left forearm. Electronically Signed   By: Neita Garnet M.D.   On: 08/16/2022 14:00   DG Hand Complete Left  Result Date: 08/16/2022 CLINICAL DATA:  Left hand and wrist pain for 1 day. Pain radiates up forearm to elbow. No known injury.  Clawtoe deformity. EXAM: LEFT FOREARM - 2 VIEW; LEFT HAND - COMPLETE 3+ VIEW COMPARISON:  Left wrist radiographs 05/30/2010, left elbow radiographs 05/30/2010 FINDINGS: Left hand: Normal bone mineralization. Neutral ulnar variance. Joint spaces are preserved. No acute fracture is seen. No dislocation. Left forearm: Normal bone mineralization. No acute  fracture is seen within the radius or ulna. A well corticated 3 mm ossicle just distal to distal humeral lateral epicondyle is unchanged from 05/30/2010 and likely represents the sequela of remote trauma. IMPRESSION: No acute fracture within the left hand or left forearm. Electronically Signed   By: Neita Garnet M.D.   On: 08/16/2022 14:00      Procedures Procedures (including critical care time)  Medications Ordered in UC Medications  ketorolac (TORADOL) injection 30 mg (30 mg Intramuscular Given 08/16/22 1404)    Initial Impression / Assessment and Plan / UC Course  I have reviewed the triage vital signs and the nursing notes.  Pertinent labs & imaging results that were available during my care of the patient were reviewed by me and considered in my medical decision making (see chart for details).      Pt is a 37 y.o.  male with 1 day of  left hand and wrist pain.  Obtained  left hand and wrist plain films.  Personally interpreted by me were unremarkable for fracture or dislocation. Radiologist report reviewed and additionally notes  no soft tissue swelling.  Given Toradol IM 10 mg. Recommended wrist brace and pt agreeable.   Patient to gradually return to normal activities, as tolerated and continue ordinary activities within the limits permitted by pain. Prescribed Naproxen sodium  for pain relief.  Tylenol PRN. Advised patient to avoid OTC NSAIDs while taking prescription NSAID. Counseled patient on red flag symptoms and when to seek immediate care.  No red flags such as progressive major motor weakness.   Patient to follow up with  orthopedic provider, if symptoms do not improve with conservative treatment.  Return and ED precautions given. Understanding voiced. Discussed MDM, treatment plan and plan for follow-up with patient who agrees with plan.   Final Clinical Impressions(s) / UC Diagnoses   Final diagnoses:  Left wrist pain  Left hand pain  Left forearm pain     Discharge Instructions      No broken bones or dislocated bones to cause your pain. Wear the wrist brace. Follow up with Dr Joseph Berkshire if pain not improving.      ED Prescriptions     Medication Sig Dispense Auth. Provider   naproxen (NAPROSYN) 500 MG tablet Take 1 tablet (500 mg total) by mouth 2 (two) times daily with a meal. 30 tablet Charvis Lightner, DO      PDMP not reviewed this encounter.   Katha Cabal, DO 08/26/22 0131

## 2022-08-16 NOTE — ED Triage Notes (Signed)
Pt c/o L hand/wrist pain x1 day. Radiates up to forearm & elbow. Denies any heavy lifting or injuries, tender to touch.

## 2022-08-16 NOTE — Discharge Instructions (Addendum)
No broken bones or dislocated bones to cause your pain. Wear the wrist brace. Follow up with Dr Joseph Berkshire if pain not improving.

## 2022-08-29 ENCOUNTER — Ambulatory Visit: Payer: 59 | Admitting: Family Medicine

## 2022-08-29 ENCOUNTER — Encounter: Payer: Self-pay | Admitting: Family Medicine

## 2022-08-29 DIAGNOSIS — F419 Anxiety disorder, unspecified: Secondary | ICD-10-CM | POA: Diagnosis not present

## 2022-08-29 DIAGNOSIS — F32A Depression, unspecified: Secondary | ICD-10-CM | POA: Diagnosis not present

## 2022-08-29 MED ORDER — SERTRALINE HCL 100 MG PO TABS: ORAL_TABLET | ORAL | 2 refills | Status: DC

## 2022-08-29 MED ORDER — TRAZODONE HCL 50 MG PO TABS: 25.0000 mg | ORAL_TABLET | Freq: Every evening | ORAL | 2 refills | Status: DC | PRN

## 2022-08-29 MED ORDER — CARIPRAZINE HCL 1.5 MG PO CAPS: 1.5000 mg | ORAL_CAPSULE | Freq: Every day | ORAL | 2 refills | Status: DC

## 2022-08-31 NOTE — Progress Notes (Signed)
     Primary Care / Sports Medicine Office Visit  Patient Information:  Patient ID: Shawn Murray, male DOB: 11-21-1985 Age: 37 y.o. MRN: [id number]   Shawn Murray is a pleasant 37 y.o. male presenting with the following:  Chief Complaint  Patient presents with   Anxiety and depression    Trazodone not working    Vitals:   08/29/22 1612  BP: 118/78  Pulse: 76  SpO2: 98%   Vitals:   08/29/22 1612  Weight: 168 lb (76.2 kg)  Height: 5\' 8"  (1.727 m)   Body mass index is 25.54 kg/m.     Independent interpretation of notes and tests performed by another provider:   None  Procedures performed:   None  Pertinent History, Exam, Impression, and Recommendations:   Shawn Murray was seen today for anxiety and depression.  Anxiety and depression Overview:    Assessment & Plan: Suboptimal interim progress, symptomatic. States that he no longer wants to follow with psychiatry.  Plan as follows: - Vraylar - continue Zoloft - PRN trazodone  Orders: -     traZODone HCl; Take 0.5-1 tablets (25-50 mg total) by mouth at bedtime as needed for sleep.  Dispense: 90 tablet; Refill: 2 -     Sertraline HCl; Take 1 tablet PO QD  Dispense: 90 tablet; Refill: 2 -     Cariprazine HCl; Take 1 capsule (1.5 mg total) by mouth daily.  Dispense: 90 capsule; Refill: 2     Orders & Medications Meds ordered this encounter  Medications   traZODone (DESYREL) 50 MG tablet    Sig: Take 0.5-1 tablets (25-50 mg total) by mouth at bedtime as needed for sleep.    Dispense:  90 tablet    Refill:  2   sertraline (ZOLOFT) 100 MG tablet    Sig: Take 1 tablet PO QD    Dispense:  90 tablet    Refill:  2    No more refills until appointment   cariprazine (VRAYLAR) 1.5 MG capsule    Sig: Take 1 capsule (1.5 mg total) by mouth daily.    Dispense:  90 capsule    Refill:  2   No orders of the defined types were placed in this encounter.    No follow-ups on file.     Shawn Banana, MD, [facility]    Primary Care Sports Medicine Primary Care and Sports Medicine at [facility]

## 2022-08-31 NOTE — Assessment & Plan Note (Signed)
Suboptimal interim progress, symptomatic. States that he no longer wants to follow with psychiatry.  Plan as follows: - Vraylar - continue Zoloft - PRN trazodone

## 2022-09-05 ENCOUNTER — Ambulatory Visit (INDEPENDENT_AMBULATORY_CARE_PROVIDER_SITE_OTHER): Payer: 59 | Admitting: Physician Assistant

## 2022-09-05 ENCOUNTER — Encounter: Payer: Self-pay | Admitting: Physician Assistant

## 2022-09-05 ENCOUNTER — Other Ambulatory Visit: Payer: Self-pay

## 2022-09-05 VITALS — BP 137/69 | HR 66 | Temp 97.9°F | Ht 68.0 in | Wt 168.0 lb

## 2022-09-05 DIAGNOSIS — K625 Hemorrhage of anus and rectum: Secondary | ICD-10-CM | POA: Diagnosis not present

## 2022-09-05 DIAGNOSIS — K219 Gastro-esophageal reflux disease without esophagitis: Secondary | ICD-10-CM

## 2022-09-05 MED ORDER — GOLYTELY 236 G PO SOLR: 4000.0000 mL | Freq: Once | ORAL | 0 refills | Status: AC

## 2022-09-05 NOTE — Progress Notes (Unsigned)
Celso Amy, PA-C [address]  [address]  [place], [place] [address]  Main: [phone]  Fax: [phone]   Gastroenterology Consultation  Referring Provider:     Jerrol Banana, MD Primary Care Physician:  Jerrol Banana, MD Primary Gastroenterologist:  Celso Amy, PA-C / Dr. Midge Minium  Reason for Consultation:     Rectal bleeding, GERD        HPI:   Shawn Murray is a 37 y.o. y/o male referred for consultation & management  by Jerrol Banana, MD.    Referred to evaluate rectal bleeding.  Patient states he has had episodes of rectal bleeding for 4 to 6 months.  Sees bright red blood after bowel movements.  He denies constipation, diarrhea, or rectal pain.  Denies abdominal pain or weight loss.  His maternal grandfather had colon cancer in his 22s.  Patient has never had a colonoscopy or GI evaluation.  Patient also has severe acid reflux for many years.  Denies dysphagia.  Has drank a lot of alcohol, sodas, and caffeine in the past.  No previous EGD.  Occasionally takes OTC antacid.  No prescription treatment.  Lab 06/15/2022 showed normal hemoglobin 16.1 and normal TSH.  Past Medical History:  Diagnosis Date   Anxiety    Depression    Heart murmur     Past Surgical History:  Procedure Laterality Date   ADENOIDECTOMY     HERNIA REPAIR     TONSILLECTOMY      Prior to Admission medications   Medication Sig Start Date End Date Taking? Authorizing Provider  cariprazine (VRAYLAR) 1.5 MG capsule Take 1 capsule (1.5 mg total) by mouth daily. 08/29/22   Jerrol Banana, MD  hydrOXYzine (ATARAX) 10 MG tablet Take 1-5 tablets (10-50 mg total) by mouth 3 (three) times daily as needed for anxiety. 05/29/22   Jerrol Banana, MD  naproxen (NAPROSYN) 500 MG tablet Take 1 tablet (500 mg total) by mouth 2 (two) times daily with a meal. 08/16/22   Brimage, Seward Meth, DO  sertraline (ZOLOFT) 100 MG tablet Take 1 tablet PO QD 08/29/22   Jerrol Banana, MD  traZODone  (DESYREL) 50 MG tablet Take 0.5-1 tablets (25-50 mg total) by mouth at bedtime as needed for sleep. 08/29/22   Jerrol Banana, MD  famotidine (PEPCID) 20 MG tablet Take 1 tablet (20 mg total) by mouth 2 (two) times daily. 12/01/19 04/03/20  Darr, Gerilyn Pilgrim, PA-C    Family History  Problem Relation Age of Onset   Alcohol abuse Mother    Lupus Mother    Asthma Mother    COPD Mother    Alcohol abuse Father    Diabetes Father    Hypertension Father    Alcohol abuse Maternal Uncle    ADD / ADHD Son      Social History   Tobacco Use   Smoking status: Former   Smokeless tobacco: Never  Building services engineer Use: Never used  Substance Use Topics   Alcohol use: Not Currently    Comment: past heavy abuse   Drug use: Yes    Types: Marijuana    Comment: past history of opioid abuse    Allergies as of 09/05/2022 - Review Complete 08/29/2022  Allergen Reaction Noted   Sulfa antibiotics Hives 03/10/2013   Sulfabenzamide Hives 10/03/2013    Review of Systems:    All systems reviewed and negative except where noted in HPI.   Physical Exam:  There were no vitals taken for this visit. No LMP for male patient. Psych:  Alert and cooperative. Normal mood and affect. General:   Alert,  Well-developed, well-nourished, pleasant and cooperative in NAD Head:  Normocephalic and atraumatic. Eyes:  Sclera clear, no icterus.   Conjunctiva pink. Neck:  Supple; no masses or thyromegaly. Lungs:  Respirations even and unlabored.  Clear throughout to auscultation.   No wheezes, crackles, or rhonchi. No acute distress. Heart:  Regular rate and rhythm; no murmurs, clicks, rubs, or gallops. Abdomen:  Normal bowel sounds.  No bruits.  Soft, and non-distended without masses, hepatosplenomegaly or hernias noted.  Mild epigastric tenderness; Rest of abdomen is not tender.  No guarding or rebound tenderness.    Neurologic:  Alert and oriented x3;  grossly normal neurologically. Psych:  Alert and cooperative. Normal  mood and affect.  Imaging Studies: DG Forearm Left  Result Date: 08/16/2022 CLINICAL DATA:  Left hand and wrist pain for 1 day. Pain radiates up forearm to elbow. No known injury. Clawtoe deformity. EXAM: LEFT FOREARM - 2 VIEW; LEFT HAND - COMPLETE 3+ VIEW COMPARISON:  Left wrist radiographs 05/30/2010, left elbow radiographs 05/30/2010 FINDINGS: Left hand: Normal bone mineralization. Neutral ulnar variance. Joint spaces are preserved. No acute fracture is seen. No dislocation. Left forearm: Normal bone mineralization. No acute fracture is seen within the radius or ulna. A well corticated 3 mm ossicle just distal to distal humeral lateral epicondyle is unchanged from 05/30/2010 and likely represents the sequela of remote trauma. IMPRESSION: No acute fracture within the left hand or left forearm. Electronically Signed   By: Neita Garnet M.D.   On: 08/16/2022 14:00   DG Hand Complete Left  Result Date: 08/16/2022 CLINICAL DATA:  Left hand and wrist pain for 1 day. Pain radiates up forearm to elbow. No known injury. Clawtoe deformity. EXAM: LEFT FOREARM - 2 VIEW; LEFT HAND - COMPLETE 3+ VIEW COMPARISON:  Left wrist radiographs 05/30/2010, left elbow radiographs 05/30/2010 FINDINGS: Left hand: Normal bone mineralization. Neutral ulnar variance. Joint spaces are preserved. No acute fracture is seen. No dislocation. Left forearm: Normal bone mineralization. No acute fracture is seen within the radius or ulna. A well corticated 3 mm ossicle just distal to distal humeral lateral epicondyle is unchanged from 05/30/2010 and likely represents the sequela of remote trauma. IMPRESSION: No acute fracture within the left hand or left forearm. Electronically Signed   By: Neita Garnet M.D.   On: 08/16/2022 14:00    Assessment and Plan:   Shawn Murray is a 37 y.o. y/o male has been referred for   Rectal Bleeding  Scheduling Colonoscopy I discussed risks of colonoscopy with patient to include risk of bleeding,  colon perforation, and risk of sedation.  Patient expressed understanding and agrees to proceed with colonoscopy.   GERD  Scheduling EGD I discussed risks of EGD with patient to include risk of bleeding, perforation, and risk of sedation.   Patient expressed understanding and agrees to proceed with EGD.  Recommend Lifestyle Modifications to prevent Acid Reflux.  Rec. Avoid coffee, sodas, peppermint, citrus fruits, and spicey foods.  Avoid eating 2-3 hours before bedtime.  Take OTC Prilosec 20 mg once daily, Pepcid 20 mg twice daily, and Tums as needed.  Follow up in 3 months with TG.  Celso Amy, PA-C

## 2022-09-05 NOTE — Patient Instructions (Signed)
Recommend Lifestyle Modifications to prevent Acid Reflux.   Rec. Avoid coffee, sodas, peppermint, citrus fruits, alcohol, and spicey foods.   Avoid eating 2-3 hours before bedtime. Take OTC Prilosec (Omeprazole) 20mg  1 tablet once daily for GERD.

## 2022-09-06 ENCOUNTER — Encounter: Payer: Self-pay | Admitting: Physician Assistant

## 2022-09-08 ENCOUNTER — Encounter: Payer: Self-pay | Admitting: Family Medicine

## 2022-09-11 ENCOUNTER — Encounter: Payer: Self-pay | Admitting: Gastroenterology

## 2022-09-12 ENCOUNTER — Encounter: Payer: Self-pay | Admitting: Gastroenterology

## 2022-09-12 NOTE — Anesthesia Preprocedure Evaluation (Addendum)
Anesthesia Evaluation  Patient identified by MRN, date of birth, ID band Patient awake    Reviewed: Allergy & Precautions, H&P , NPO status , Patient's Chart, lab work & pertinent test results  Airway Mallampati: I      Comment: Full beard and mustache Dental   Missing right upper lateral incisor:   Pulmonary Current Smoker and Patient abstained from smoking. Quit smoking cigarettes few years ago, but smokes "a lot" of marijuana daily          Cardiovascular negative cardio ROS + Valvular Problems/Murmurs   Hx heart murmur; cannot find echo on epic   Neuro/Psych   Anxiety Depression    negative neurological ROS  negative psych ROS   GI/Hepatic Neg liver ROS,GERD  ,,  Endo/Other  negative endocrine ROS    Renal/GU negative Renal ROS  negative genitourinary   Musculoskeletal  (+) Arthritis ,    Abdominal   Peds negative pediatric ROS (+)  Hematology negative hematology ROS (+)   Anesthesia Other Findings Heart murmur  Depression Anxiety  ADHD GERD (gastroesophageal reflux disease) History of pericarditis Arthritis  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 Hx rectal bleeding Cannabis use disorder, severe, dependence (HCC) Opioid use disorder, moderate, in sustained remission (HCC)  Alcohol use disorder, mild, in sustained remission, sober x 7 years    Reproductive/Obstetrics negative OB ROS                             Anesthesia Physical Anesthesia Plan  ASA: 3  Anesthesia Plan: General   Post-op Pain Management:    Induction: Intravenous  PONV Risk Score and Plan:   Airway Management Planned: Natural Airway and Nasal Cannula  Additional Equipment:   Intra-op Plan:   Post-operative Plan:   Informed Consent: I have reviewed the patients History and Physical, chart, labs and discussed the procedure including the risks, benefits and alternatives for  the proposed anesthesia with the patient or authorized representative who has indicated his/her understanding and acceptance.     Dental Advisory Given  Plan Discussed with: Anesthesiologist, CRNA and Surgeon  Anesthesia Plan Comments: (Patient consented for risks of anesthesia including but not limited to:  - adverse reactions to medications - risk of airway placement if required - damage to eyes, teeth, lips or other oral mucosa - nerve damage due to positioning  - sore throat or hoarseness - Damage to heart, brain, nerves, lungs, other parts of body or loss of life  Patient voiced understanding.)        Anesthesia Quick Evaluation

## 2022-09-18 ENCOUNTER — Ambulatory Visit: Payer: 59 | Admitting: Anesthesiology

## 2022-09-18 ENCOUNTER — Ambulatory Visit
Admission: RE | Admit: 2022-09-18 | Discharge: 2022-09-18 | Disposition: A | Discharge status: Home / Self Care | Payer: 59 | Source: Ambulatory Visit | Attending: Gastroenterology | Admitting: Gastroenterology

## 2022-09-18 ENCOUNTER — Encounter: Payer: Self-pay | Admitting: Gastroenterology

## 2022-09-18 ENCOUNTER — Encounter
Admission: RE | Disposition: A | Discharge status: Home / Self Care | Payer: Self-pay | Source: Ambulatory Visit | Attending: Gastroenterology

## 2022-09-18 ENCOUNTER — Other Ambulatory Visit: Payer: Self-pay

## 2022-09-18 DIAGNOSIS — K640 First degree hemorrhoids: Secondary | ICD-10-CM | POA: Diagnosis not present

## 2022-09-18 DIAGNOSIS — K2100 Gastro-esophageal reflux disease with esophagitis, without bleeding: Secondary | ICD-10-CM | POA: Insufficient documentation

## 2022-09-18 DIAGNOSIS — F17210 Nicotine dependence, cigarettes, uncomplicated: Secondary | ICD-10-CM | POA: Diagnosis not present

## 2022-09-18 DIAGNOSIS — K219 Gastro-esophageal reflux disease without esophagitis: Secondary | ICD-10-CM | POA: Diagnosis not present

## 2022-09-18 DIAGNOSIS — R12 Heartburn: Secondary | ICD-10-CM | POA: Diagnosis not present

## 2022-09-18 DIAGNOSIS — F32A Depression, unspecified: Secondary | ICD-10-CM | POA: Diagnosis not present

## 2022-09-18 DIAGNOSIS — K625 Hemorrhage of anus and rectum: Secondary | ICD-10-CM | POA: Diagnosis not present

## 2022-09-18 DIAGNOSIS — F419 Anxiety disorder, unspecified: Secondary | ICD-10-CM | POA: Insufficient documentation

## 2022-09-18 DIAGNOSIS — K921 Melena: Secondary | ICD-10-CM | POA: Diagnosis present

## 2022-09-18 HISTORY — DX: Opioid dependence, in remission: F11.21

## 2022-09-18 HISTORY — DX: Cannabis dependence, uncomplicated: F12.20

## 2022-09-18 HISTORY — PX: ESOPHAGOGASTRODUODENOSCOPY (EGD) WITH PROPOFOL: SHX5813

## 2022-09-18 HISTORY — DX: Unspecified osteoarthritis, unspecified site: M19.90

## 2022-09-18 HISTORY — DX: Alcohol abuse, in remission: F10.11

## 2022-09-18 HISTORY — DX: Reaction to severe stress, unspecified: F43.9

## 2022-09-18 HISTORY — PX: COLONOSCOPY WITH PROPOFOL: SHX5780

## 2022-09-18 HISTORY — DX: Unspecified mood (affective) disorder: F39

## 2022-09-18 HISTORY — DX: Personal history of other diseases of the circulatory system: Z86.79

## 2022-09-18 SURGERY — COLONOSCOPY WITH PROPOFOL
Anesthesia: General

## 2022-09-18 MED ORDER — LACTATED RINGERS IV SOLN: INTRAVENOUS | Status: DC

## 2022-09-18 MED ORDER — LIDOCAINE HCL (CARDIAC) PF 100 MG/5ML IV SOSY
PREFILLED_SYRINGE | INTRAVENOUS | Status: DC | PRN
  Administered 2022-09-18: 60 mg via INTRAVENOUS

## 2022-09-18 MED ORDER — PROPOFOL 10 MG/ML IV BOLUS
INTRAVENOUS | Status: DC | PRN
  Administered 2022-09-18: 30 mg via INTRAVENOUS
  Administered 2022-09-18 (×2): 20 mg via INTRAVENOUS
  Administered 2022-09-18: 100 mg via INTRAVENOUS
  Administered 2022-09-18: 30 mg via INTRAVENOUS

## 2022-09-18 MED ORDER — GLYCOPYRROLATE 0.2 MG/ML IJ SOLN
INTRAMUSCULAR | Status: DC | PRN
  Administered 2022-09-18: .2 mg via INTRAVENOUS

## 2022-09-18 MED ORDER — STERILE WATER FOR IRRIGATION IR SOLN
Status: DC | PRN
  Administered 2022-09-18: 500 mL

## 2022-09-18 MED ORDER — SODIUM CHLORIDE 0.9 % IV SOLN: INTRAVENOUS | Status: DC

## 2022-09-18 MED ORDER — STERILE WATER FOR IRRIGATION IR SOLN
Status: DC | PRN
  Administered 2022-09-18: 120 mL

## 2022-09-18 SURGICAL SUPPLY — 36 items
BALLN DILATOR 12-15 8 (BALLOONS)
BALLN DILATOR 15-18 8 (BALLOONS)
BALLN DILATOR CRE 0-12 8 (BALLOONS)
BALLN DILATOR ESOPH 8 10 CRE (MISCELLANEOUS) IMPLANT
BALLOON DILATOR 12-15 8 (BALLOONS) IMPLANT
BALLOON DILATOR 15-18 8 (BALLOONS) IMPLANT
BALLOON DILATOR CRE 0-12 8 (BALLOONS) IMPLANT
BLOCK BITE 60FR ADLT L/F GRN (MISCELLANEOUS) ×1 IMPLANT
CLIP HMST 235XBRD CATH ROT (MISCELLANEOUS) IMPLANT
CLIP RESOLUTION 360 11X235 (MISCELLANEOUS)
ELECT REM PT RETURN 9FT ADLT (ELECTROSURGICAL)
ELECTRODE REM PT RTRN 9FT ADLT (ELECTROSURGICAL) IMPLANT
FCP ESCP3.2XJMB 240X2.8X (MISCELLANEOUS)
FORCEPS BIOP RAD 4 LRG CAP 4 (CUTTING FORCEPS) IMPLANT
FORCEPS BIOP RJ4 240 W/NDL (MISCELLANEOUS)
FORCEPS ESCP3.2XJMB 240X2.8X (MISCELLANEOUS) IMPLANT
GOWN CVR UNV OPN BCK APRN NK (MISCELLANEOUS) ×2 IMPLANT
GOWN ISOL THUMB LOOP REG UNIV (MISCELLANEOUS) ×2
INJECTOR VARIJECT VIN23 (MISCELLANEOUS) IMPLANT
KIT DEFENDO VALVE AND CONN (KITS) IMPLANT
KIT PRC NS LF DISP ENDO (KITS) ×1 IMPLANT
KIT PROCEDURE OLYMPUS (KITS) ×1
MANIFOLD NEPTUNE II (INSTRUMENTS) ×1 IMPLANT
MARKER SPOT ENDO TATTOO 5ML (MISCELLANEOUS) IMPLANT
PROBE APC STR FIRE (PROBE) IMPLANT
RETRIEVER NET PLAT FOOD (MISCELLANEOUS) IMPLANT
RETRIEVER NET ROTH 2.5X230 LF (MISCELLANEOUS) IMPLANT
SNARE COLD EXACTO (MISCELLANEOUS) IMPLANT
SNARE SHORT THROW 13M SML OVAL (MISCELLANEOUS) IMPLANT
SNARE SHORT THROW 30M LRG OVAL (MISCELLANEOUS) IMPLANT
SNARE SNG USE RND 15MM (INSTRUMENTS) IMPLANT
SYR INFLATION 60ML (SYRINGE) IMPLANT
TRAP ETRAP POLY (MISCELLANEOUS) IMPLANT
VARIJECT INJECTOR VIN23 (MISCELLANEOUS)
WATER STERILE IRR 250ML POUR (IV SOLUTION) ×1 IMPLANT
WIRE CRE 18-20MM 8CM F G (MISCELLANEOUS) IMPLANT

## 2022-09-18 NOTE — Transfer of Care (Signed)
Immediate Anesthesia Transfer of Care Note  Patient: Shawn Murray  Procedure(s) Performed: COLONOSCOPY WITH PROPOFOL ESOPHAGOGASTRODUODENOSCOPY (EGD) WITH PROPOFOL  Patient Location: PACU  Anesthesia Type: General  Level of Consciousness: awake, alert  and patient cooperative  Airway and Oxygen Therapy: Patient Spontanous Breathing and Patient connected to supplemental oxygen  Post-op Assessment: Post-op Vital signs reviewed, Patient's Cardiovascular Status Stable, Respiratory Function Stable, Patent Airway and No signs of Nausea or vomiting  Post-op Vital Signs: Reviewed and stable  Complications: No notable events documented.

## 2022-09-18 NOTE — H&P (Signed)
Midge Minium, MD [facility] [address], [address] [place], [place] [address] Phone:[phone] Fax : [phone]  Primary Care Physician:  Jerrol Banana, MD Primary Gastroenterologist:  Dr. Servando Snare  Pre-Procedure History & Physical: HPI:  Shawn Murray is a 37 y.o. male is here for an endoscopy and colonoscopy.   Past Medical History:  Diagnosis Date   ADHD    Alcohol use disorder, mild, in sustained remission    Anxiety    Arthritis    hands, wrists, fingers   Cannabis use disorder, severe, dependence (HCC)    Depression    Episodic mood disorder (HCC)    GERD (gastroesophageal reflux disease)    Heart murmur    History of pericarditis    Opioid use disorder, moderate, in sustained remission (HCC)    Trauma and stressor-related disorder     Past Surgical History:  Procedure Laterality Date   ADENOIDECTOMY     HERNIA REPAIR     TONSILLECTOMY      Prior to Admission medications   Medication Sig Start Date End Date Taking? Authorizing Provider  cariprazine (VRAYLAR) 1.5 MG capsule Take 1 capsule (1.5 mg total) by mouth daily. 08/29/22  Yes Jerrol Banana, MD  hydrOXYzine (ATARAX) 10 MG tablet Take 1-5 tablets (10-50 mg total) by mouth 3 (three) times daily as needed for anxiety. 05/29/22  Yes Jerrol Banana, MD  sertraline (ZOLOFT) 100 MG tablet Take 1 tablet PO QD 08/29/22  Yes Jerrol Banana, MD  traZODone (DESYREL) 50 MG tablet Take 0.5-1 tablets (25-50 mg total) by mouth at bedtime as needed for sleep. 08/29/22  Yes Jerrol Banana, MD  naproxen (NAPROSYN) 500 MG tablet Take 1 tablet (500 mg total) by mouth 2 (two) times daily with a meal. Patient not taking: Reported on 09/05/2022 08/16/22   Katha Cabal, DO  famotidine (PEPCID) 20 MG tablet Take 1 tablet (20 mg total) by mouth 2 (two) times daily. 12/01/19 04/03/20  Darr, Gerilyn Pilgrim, PA-C    Allergies as of 09/05/2022 - Review Complete 09/05/2022  Allergen Reaction Noted   Sulfa antibiotics Hives 03/10/2013    Sulfabenzamide Hives 10/03/2013    Family History  Problem Relation Age of Onset   Alcohol abuse Mother    Lupus Mother    Asthma Mother    COPD Mother    Alcohol abuse Father    Diabetes Father    Hypertension Father    Alcohol abuse Maternal Uncle    ADD / ADHD Son     Social History   Socioeconomic History   Marital status: Significant Other    Spouse name: Not on file   Number of children: 4   Years of education: Not on file   Highest education level: Not on file  Occupational History   Occupation: Curator    Comment: [employer]  Tobacco Use   Smoking status: Every Day    Types: Cigarettes   Smokeless tobacco: Former  Building services engineer Use: Every day  Substance and Sexual Activity   Alcohol use: Not Currently    Comment: past heavy abuse - sober   Drug use: Yes    Types: Marijuana    Comment: past history of opioid abuse   Sexual activity: Yes  Other Topics Concern   Not on file  Social History Narrative   Not on file   Social Determinants of Health   Financial Resource Strain: High Risk (10/26/2021)   Overall Financial Resource Strain (CARDIA)  Difficulty of Paying Living Expenses: Very hard  Food Insecurity: No Food Insecurity (10/26/2021)   Hunger Vital Sign    Worried About Running Out of Food in the Last Year: Never true    Ran Out of Food in the Last Year: Never true  Transportation Needs: No Transportation Needs (10/26/2021)   PRAPARE - Administrator, Civil Service (Medical): No    Lack of Transportation (Non-Medical): No  Physical Activity: Not on file  Stress: Not on file  Social Connections: Not on file  Intimate Partner Violence: Not At Risk (10/26/2021)   Humiliation, Afraid, Rape, and Kick questionnaire    Fear of Current or Ex-Partner: No    Emotionally Abused: No    Physically Abused: No    Sexually Abused: No    Review of Systems: See HPI, otherwise negative ROS  Physical Exam: BP 118/63   Pulse (!) 55   Temp 98.2 F  (36.8 C) (Temporal)   Resp 12   Ht 5\' 9"  (1.753 m)   Wt 74.6 kg   SpO2 97%   BMI 24.29 kg/m  General:   Alert,  pleasant and cooperative in NAD Head:  Normocephalic and atraumatic. Neck:  Supple; no masses or thyromegaly. Lungs:  Clear throughout to auscultation.    Heart:  Regular rate and rhythm. Abdomen:  Soft, nontender and nondistended. Normal bowel sounds, without guarding, and without rebound.   Neurologic:  Alert and  oriented x4;  grossly normal neurologically.  Impression/Plan: Shawn Murray is here for an endoscopy and colonoscopy to be performed for GERD and rectal bledding  Risks, benefits, limitations, and alternatives regarding  endoscopy and colonoscopy have been reviewed with the patient.  Questions have been answered.  All parties agreeable.   Midge Minium, MD  09/18/2022, 9:57 AM

## 2022-09-18 NOTE — Op Note (Signed)
[facility] Gastroenterology Patient Name: Shawn Murray Procedure Date: 09/18/2022 11:01 AM MRN: [id number] Account #: [id number] Date of Birth: 1985-06-05 Admit Type: Outpatient Age: 37 Room: [facility] OR ROOM 01 Gender: Male Note Status: Finalized Instrument Name: [id number] Procedure:             Upper GI endoscopy Indications:           Heartburn Providers:             Midge Minium MD, MD Medicines:             Propofol per Anesthesia Complications:         No immediate complications. Procedure:             Pre-Anesthesia Assessment:                        - Prior to the procedure, a History and Physical was                         performed, and patient medications and allergies were                         reviewed. The patient's tolerance of previous                         anesthesia was also reviewed. The risks and benefits                         of the procedure and the sedation options and risks                         were discussed with the patient. All questions were                         answered, and informed consent was obtained. Prior                         Anticoagulants: The patient has taken no anticoagulant                         or antiplatelet agents. ASA Grade Assessment: II - A                         patient with mild systemic disease. After reviewing                         the risks and benefits, the patient was deemed in                         satisfactory condition to undergo the procedure.                        After obtaining informed consent, the endoscope was                         passed under direct vision. Throughout the procedure,                         the patient's blood pressure, pulse, and  oxygen                         saturations were monitored continuously. The Endoscope                         was introduced through the mouth, and advanced to the                         second part of duodenum. The upper GI endoscopy  was                         accomplished without difficulty. The patient tolerated                         the procedure well. Findings:      LA Grade B (one or more mucosal breaks greater than 5 mm, not extending       between the tops of two mucosal folds) esophagitis with no bleeding was       found at the gastroesophageal junction.      The stomach was normal.      The examined duodenum was normal. Impression:            - LA Grade B reflux esophagitis with no bleeding.                        - Normal stomach.                        - Normal examined duodenum.                        - No specimens collected. Recommendation:        - Discharge patient to home.                        - Resume previous diet.                        - Continue present medications.                        - Perform a colonoscopy today. Procedure Code(s):     --- Professional ---                        [phone], Esophagogastroduodenoscopy, flexible,                         transoral; diagnostic, including collection of                         specimen(s) by brushing or washing, when performed                         (separate procedure) Diagnosis Code(s):     --- Professional ---                        R12, Heartburn                        K21.00, Gastro-esophageal reflux  disease with                         esophagitis, without bleeding CPT copyright 2022 American Medical Association. All rights reserved. The codes documented in this report are preliminary and upon coder review may  be revised to meet current compliance requirements. Midge Minium MD, MD 09/18/2022 11:19:58 AM This report has been signed electronically. Number of Addenda: 0 Note Initiated On: 09/18/2022 11:01 AM Total Procedure Duration: 0 hours 1 minute 34 seconds  Estimated Blood Loss:  Estimated blood loss: none.      [facility]

## 2022-09-18 NOTE — Anesthesia Postprocedure Evaluation (Signed)
Anesthesia Post Note  Patient: Shawn Murray  Procedure(s) Performed: COLONOSCOPY WITH PROPOFOL ESOPHAGOGASTRODUODENOSCOPY (EGD) WITH PROPOFOL  Patient location during evaluation: PACU Anesthesia Type: General Level of consciousness: awake and alert Pain management: pain level controlled Vital Signs Assessment: post-procedure vital signs reviewed and stable Respiratory status: spontaneous breathing, nonlabored ventilation, respiratory function stable and patient connected to nasal cannula oxygen Cardiovascular status: blood pressure returned to baseline and stable Postop Assessment: no apparent nausea or vomiting Anesthetic complications: no   No notable events documented.   Last Vitals:  Vitals:   09/18/22 1145 09/18/22 1150  BP: (!) 109/56 112/71  Pulse: 69 69  Resp: 17 14  Temp:  36.7 C  SpO2: 97% 96%    Last Pain:  Vitals:   09/18/22 1145  TempSrc:   PainSc: 0-No pain                 Jakyrie Totherow C [facility]

## 2022-09-18 NOTE — Op Note (Signed)
[facility] Gastroenterology Patient Name: Shawn Murray Procedure Date: 09/18/2022 11:00 AM MRN: [id number] Account #: [id number] Date of Birth: 1985/06/23 Admit Type: Outpatient Age: 37 Room: [facility] OR ROOM 01 Gender: Male Note Status: Finalized Instrument Name: [id number] Procedure:             Colonoscopy Indications:           Hematochezia Providers:             Midge Minium MD, MD Medicines:             Propofol per Anesthesia Complications:         No immediate complications. Procedure:             Pre-Anesthesia Assessment:                        - Prior to the procedure, a History and Physical was                         performed, and patient medications and allergies were                         reviewed. The patient's tolerance of previous                         anesthesia was also reviewed. The risks and benefits                         of the procedure and the sedation options and risks                         were discussed with the patient. All questions were                         answered, and informed consent was obtained. Prior                         Anticoagulants: The patient has taken no anticoagulant                         or antiplatelet agents. ASA Grade Assessment: II - A                         patient with mild systemic disease. After reviewing                         the risks and benefits, the patient was deemed in                         satisfactory condition to undergo the procedure.                        After obtaining informed consent, the colonoscope was                         passed under direct vision. Throughout the procedure,                         the patient's blood pressure, pulse, and oxygen  saturations were monitored continuously. The                         Colonoscope was introduced through the anus and                         advanced to the the cecum, identified by appendiceal                          orifice and ileocecal valve. The colonoscopy was                         performed without difficulty. The patient tolerated                         the procedure well. The quality of the bowel                         preparation was excellent. Findings:      The perianal and digital rectal examinations were normal.      Non-bleeding internal hemorrhoids were found during retroflexion. The       hemorrhoids were Grade I (internal hemorrhoids that do not prolapse). Impression:            - Non-bleeding internal hemorrhoids.                        - No specimens collected. Recommendation:        - Discharge patient to home.                        - High fiber diet.                        - Continue present medications.                        - Repeat colonoscopy in 10 years for screening                         purposes. Procedure Code(s):     --- Professional ---                        [phone], Colonoscopy, flexible; diagnostic, including                         collection of specimen(s) by brushing or washing, when                         performed (separate procedure) Diagnosis Code(s):     --- Professional ---                        K92.1, Melena (includes Hematochezia) CPT copyright 2022 American Medical Association. All rights reserved. The codes documented in this report are preliminary and upon coder review may  be revised to meet current compliance requirements. Midge Minium MD, MD 09/18/2022 11:31:00 AM This report has been signed electronically. Number of Addenda: 0 Note Initiated On: 09/18/2022 11:00 AM Scope Withdrawal Time: 0 hours 6 minutes 49 seconds  Total Procedure Duration: 0 hours 8 minutes  17 seconds  Estimated Blood Loss:  Estimated blood loss: none.      [facility]

## 2022-09-19 ENCOUNTER — Encounter: Payer: Self-pay | Admitting: Gastroenterology

## 2022-09-25 ENCOUNTER — Encounter: Payer: Self-pay | Admitting: Family Medicine

## 2022-09-25 NOTE — Telephone Encounter (Signed)
Please advise 

## 2022-09-27 ENCOUNTER — Telehealth: Payer: Self-pay | Admitting: Psychiatry

## 2022-09-27 ENCOUNTER — Ambulatory Visit: Payer: 59 | Admitting: Psychiatry

## 2022-09-27 NOTE — Telephone Encounter (Signed)
We have left messages re: new location and to reschedule 09/27/22 appointment.  Per the office notes from his PCP on 08/29/22: Assessment & Plan: Suboptimal interim progress, symptomatic. States that he no longer wants to follow with psychiatry

## 2022-09-29 NOTE — Telephone Encounter (Signed)
Noted  
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
## 2022-12-06 ENCOUNTER — Ambulatory Visit: Payer: 59 | Admitting: Physician Assistant

## 2023-01-08 ENCOUNTER — Ambulatory Visit
Admission: EM | Admit: 2023-01-08 | Discharge: 2023-01-08 | Disposition: A | Discharge status: Home / Self Care | Payer: 59 | Attending: Internal Medicine | Admitting: Internal Medicine

## 2023-01-08 DIAGNOSIS — R42 Dizziness and giddiness: Secondary | ICD-10-CM | POA: Diagnosis present

## 2023-01-08 DIAGNOSIS — B349 Viral infection, unspecified: Secondary | ICD-10-CM | POA: Diagnosis not present

## 2023-01-08 DIAGNOSIS — J45909 Unspecified asthma, uncomplicated: Secondary | ICD-10-CM | POA: Insufficient documentation

## 2023-01-08 DIAGNOSIS — Z1152 Encounter for screening for COVID-19: Secondary | ICD-10-CM | POA: Diagnosis not present

## 2023-01-08 DIAGNOSIS — R051 Acute cough: Secondary | ICD-10-CM | POA: Insufficient documentation

## 2023-01-08 DIAGNOSIS — J029 Acute pharyngitis, unspecified: Secondary | ICD-10-CM | POA: Diagnosis not present

## 2023-01-08 LAB — RESP PANEL BY RT-PCR (FLU A&B, COVID) ARPGX2
Influenza A by PCR: NEGATIVE
Influenza B by PCR: NEGATIVE
SARS Coronavirus 2 by RT PCR: NEGATIVE

## 2023-01-08 LAB — GROUP A STREP BY PCR: Group A Strep by PCR: NOT DETECTED

## 2023-01-08 NOTE — ED Provider Notes (Signed)
[facility]    CSN: [id number] Arrival date & time: 01/08/23  4782      History   Chief Complaint Chief Complaint  Patient presents with   Dizziness   Cough   Generalized Body Aches    HPI Shawn Murray is a 37 y.o. male  presents for evaluation of URI symptoms for 1 days. Patient reports associated symptoms of cough, congestion, chills, fatigue, body aches, intermittent dizziness. Denies N/V/D, fevers, ear pain, shortness of breath, neck pain. Syncope, HA. Patient does have a hx of asthma.  Has an albuterol inhaler that he has not needed to use.  States he needs a refill on this.  Patient does have a history of smoking.  Reports sick contacts via work.  Pt has taken nothing OTC for symptoms. Pt has no other concerns at this time.    Dizziness Cough Associated symptoms: chills and sore throat     Past Medical History:  Diagnosis Date   ADHD    Alcohol use disorder, mild, in sustained remission    Anxiety    Arthritis    hands, wrists, fingers   Cannabis use disorder, severe, dependence (HCC)    Depression    Episodic mood disorder (HCC)    GERD (gastroesophageal reflux disease)    Heart murmur    History of pericarditis    Opioid use disorder, moderate, in sustained remission (HCC)    Trauma and stressor-related disorder     Patient Active Problem List   Diagnosis Date Noted   Gastroesophageal reflux disease 09/18/2022   Rectal bleeding 09/18/2022   Episodic mood disorder (HCC) 08/03/2022   Cannabis use disorder, severe, dependence (HCC) 08/03/2022   High risk medication use 08/03/2022   Opioid use disorder, moderate, in sustained remission (HCC) 08/03/2022   Alcohol use disorder, mild, in sustained remission 08/03/2022   History of ADHD 08/03/2022   Annual physical exam 05/29/2022   Trigger finger 05/29/2022   Adjustment disorder 04/01/2017   Anxiety and depression 04/01/2017   Trauma and stressor-related disorder 04/01/2017   Suicidal  ideation 04/01/2017    Past Surgical History:  Procedure Laterality Date   ADENOIDECTOMY     COLONOSCOPY WITH PROPOFOL N/A 09/18/2022   Procedure: COLONOSCOPY WITH PROPOFOL;  Surgeon: Midge Minium, MD;  Location: [facility];  Service: Endoscopy;  Laterality: N/A;   ESOPHAGOGASTRODUODENOSCOPY (EGD) WITH PROPOFOL N/A 09/18/2022   Procedure: ESOPHAGOGASTRODUODENOSCOPY (EGD) WITH PROPOFOL;  Surgeon: Midge Minium, MD;  Location: [facility];  Service: Endoscopy;  Laterality: N/A;   HERNIA REPAIR     TONSILLECTOMY         Home Medications    Prior to Admission medications   Medication Sig Start Date End Date Taking? Authorizing Provider  cariprazine (VRAYLAR) 1.5 MG capsule Take 1 capsule (1.5 mg total) by mouth daily. 08/29/22   Jerrol Banana, MD  hydrOXYzine (ATARAX) 10 MG tablet Take 1-5 tablets (10-50 mg total) by mouth 3 (three) times daily as needed for anxiety. 05/29/22   Jerrol Banana, MD  naproxen (NAPROSYN) 500 MG tablet Take 1 tablet (500 mg total) by mouth 2 (two) times daily with a meal. Patient not taking: Reported on 09/05/2022 08/16/22   Katha Cabal, DO  sertraline (ZOLOFT) 100 MG tablet Take 1 tablet PO QD 08/29/22   Jerrol Banana, MD  traZODone (DESYREL) 50 MG tablet Take 0.5-1 tablets (25-50 mg total) by mouth at bedtime as needed for sleep. 08/29/22   Jerrol Banana, MD  famotidine (PEPCID) 20 MG tablet Take 1 tablet (20 mg total) by mouth 2 (two) times daily. 12/01/19 04/03/20  Darr, Gerilyn Pilgrim, PA-C    Family History Family History  Problem Relation Age of Onset   Alcohol abuse Mother    Lupus Mother    Asthma Mother    COPD Mother    Alcohol abuse Father    Diabetes Father    Hypertension Father    Alcohol abuse Maternal Uncle    ADD / ADHD Son     Social History Social History   Tobacco Use   Smoking status: Every Day    Types: Cigarettes   Smokeless tobacco: Former  Building services engineer status: Every Day  Substance Use Topics    Alcohol use: Not Currently    Comment: past heavy abuse - sober   Drug use: Yes    Types: Marijuana    Comment: past history of opioid abuse     Allergies   Sulfa antibiotics and Sulfabenzamide   Review of Systems Review of Systems  Constitutional:  Positive for chills and fatigue.  HENT:  Positive for congestion and sore throat.   Respiratory:  Positive for cough.   Neurological:  Positive for dizziness.     Physical Exam Triage Vital Signs ED Triage Vitals  Encounter Vitals Group     BP 01/08/23 0938 133/63     Systolic BP Percentile --      Diastolic BP Percentile --      Pulse Rate 01/08/23 0938 61     Resp 01/08/23 0938 16     Temp 01/08/23 0938 98.1 F (36.7 C)     Temp Source 01/08/23 0938 Oral     SpO2 01/08/23 0938 99 %     Weight --      Height --      Head Circumference --      Peak Flow --      Pain Score 01/08/23 0937 0     Pain Loc --      Pain Education --      Exclude from Growth Chart --    No data found.  Updated Vital Signs BP 133/63 (BP Location: Left Arm)   Pulse 61   Temp 98.1 F (36.7 C) (Oral)   Resp 16   SpO2 99%   Visual Acuity Right Eye Distance:   Left Eye Distance:   Bilateral Distance:    Right Eye Near:   Left Eye Near:    Bilateral Near:     Physical Exam Vitals and nursing note reviewed.  Constitutional:      General: He is not in acute distress.    Appearance: Normal appearance. He is not ill-appearing or toxic-appearing.  HENT:     Head: Normocephalic and atraumatic.     Right Ear: Tympanic membrane and ear canal normal.     Left Ear: Tympanic membrane and ear canal normal.     Nose: Congestion present.     Mouth/Throat:     Mouth: Mucous membranes are moist.     Pharynx: Posterior oropharyngeal erythema present.  Eyes:     Pupils: Pupils are equal, round, and reactive to light.  Cardiovascular:     Rate and Rhythm: Normal rate and regular rhythm.     Heart sounds: Normal heart sounds.  Pulmonary:      Effort: Pulmonary effort is normal.     Breath sounds: Normal breath sounds.  Musculoskeletal:     Cervical back:  Normal range of motion and neck supple.  Lymphadenopathy:     Cervical: No cervical adenopathy.  Skin:    General: Skin is warm and dry.  Neurological:     General: No focal deficit present.     Mental Status: He is alert and oriented to person, place, and time.  Psychiatric:        Mood and Affect: Mood normal.        Behavior: Behavior normal.      UC Treatments / Results  Labs (all labs ordered are listed, but only abnormal results are displayed) Labs Reviewed  GROUP A STREP BY PCR  RESP PANEL BY RT-PCR (FLU A&B, COVID) [id number]    EKG   Radiology No results found.  Procedures Procedures (including critical care time)  Medications Ordered in UC Medications - No data to display  Initial Impression / Assessment and Plan / UC Course  I have reviewed the triage vital signs and the nursing notes.  Pertinent labs & imaging results that were available during my care of the patient were reviewed by me and considered in my medical decision making (see chart for details).     Reviewed exam and symptoms with patient.  No red flags.  Negative COVID, strep, flu PCR.  Discussed viral illness and symptomatic treatment.  PCP follow-up 2 to 3 days for recheck.  ER precautions reviewed. Final Clinical Impressions(s) / UC Diagnoses   Final diagnoses:  Acute cough  Sore throat  Viral illness     Discharge Instructions      Lots of rest and fluids.  You may take over-the-counter cough medicine, Tylenol or ibuprofen as needed.  Follow-up with your PCP if your symptoms do not improve.  Please go to the ER for any worsening symptoms.  I hope you feel better soon!     ED Prescriptions   None    PDMP not reviewed this encounter.   Radford Pax, NP 01/08/23 1131

## 2023-01-08 NOTE — Discharge Instructions (Addendum)
Lots of rest and fluids.  You may take over-the-counter cough medicine, Tylenol or ibuprofen as needed.  Follow-up with your PCP if your symptoms do not improve.  Please go to the ER for any worsening symptoms.  I hope you feel better soon!

## 2023-01-08 NOTE — ED Triage Notes (Signed)
Patient presents to UC for body aches, dizziness, cough, fatigue since this morning. State he felt the same last week and symptoms had resolved but returned today. No OTC meds for symptom relief.

## 2023-01-09 ENCOUNTER — Encounter: Payer: 59 | Admitting: Family Medicine

## 2023-01-10 NOTE — Progress Notes (Signed)
Erroneous - disregard °

## 2023-03-25 ENCOUNTER — Other Ambulatory Visit: Payer: Self-pay | Admitting: Family Medicine

## 2023-03-25 DIAGNOSIS — F32A Depression, unspecified: Secondary | ICD-10-CM

## 2023-03-26 ENCOUNTER — Encounter: Payer: Self-pay | Admitting: Family Medicine

## 2023-03-26 NOTE — Telephone Encounter (Signed)
 Please review. JM

## 2023-03-27 ENCOUNTER — Other Ambulatory Visit: Payer: Self-pay | Admitting: Family Medicine

## 2023-03-27 DIAGNOSIS — F419 Anxiety disorder, unspecified: Secondary | ICD-10-CM

## 2023-03-27 MED ORDER — SERTRALINE HCL 100 MG PO TABS
ORAL_TABLET | ORAL | 2 refills | Status: AC
Start: 2023-03-27 — End: ?

## 2023-03-27 MED ORDER — CARIPRAZINE HCL 1.5 MG PO CAPS: 1.5000 mg | ORAL_CAPSULE | Freq: Every day | ORAL | 2 refills | Status: DC

## 2023-03-27 MED ORDER — HYDROXYZINE HCL 10 MG PO TABS
10.0000 mg | ORAL_TABLET | Freq: Three times a day (TID) | ORAL | 2 refills | Status: AC | PRN
Start: 2023-03-27 — End: ?

## 2023-03-27 NOTE — Telephone Encounter (Signed)
 Please review. Thanks JM

## 2023-03-29 ENCOUNTER — Other Ambulatory Visit: Payer: Self-pay

## 2023-03-29 DIAGNOSIS — Z7689 Persons encountering health services in other specified circumstances: Secondary | ICD-10-CM

## 2023-03-29 NOTE — Telephone Encounter (Signed)
 FYI  KP

## 2023-04-02 ENCOUNTER — Telehealth: Payer: Self-pay | Admitting: *Deleted

## 2023-04-02 NOTE — Progress Notes (Signed)
 Complex Care Management Note  Care Guide Note 04/02/2023 Name: ABDURAHMAN RUGG MRN: [id number] DOB: 1985-09-25  Shawn Murray is a 38 y.o. year old male who sees Alvia, Selinda PARAS, MD for primary care. I reached out to Okey JAYSON Cagey by phone today to offer complex care management services.  Mr. Hann was given information about Complex Care Management services today including:   The Complex Care Management services include support from the care team which includes your Nurse Coordinator, Clinical Social Worker, or Pharmacist.  The Complex Care Management team is here to help remove barriers to the health concerns and goals most important to you. Complex Care Management services are voluntary, and the patient may decline or stop services at any time by request to their care team member.   Complex Care Management Consent Status: Patient agreed to services and verbal consent obtained.   Follow up plan:  Telephone appointment with complex care management team member scheduled for:  04/04/2023  Encounter Outcome:  Patient Scheduled  Thedford Franks, [facility] Care Coordination Care Guide Direct Dial: [phone]

## 2023-04-04 ENCOUNTER — Ambulatory Visit: Payer: Self-pay

## 2023-04-04 NOTE — Patient Outreach (Signed)
  Care Coordination   04/04/2023 Name: RAYLIN WINER MRN: [id number] DOB: June 16, 1985   Care Coordination Outreach Attempts:  An unsuccessful outreach was attempted for an appointment today.  Follow Up Plan:  Additional outreach attempts will be made to offer the patient complex care management information and services.   Encounter Outcome:  No Answer   Care Coordination Interventions:  No, not indicated    SIG Tillman Gardener, BSW Social Worker [phone]

## 2023-04-09 ENCOUNTER — Telehealth: Payer: Self-pay | Admitting: *Deleted

## 2023-04-09 NOTE — Progress Notes (Signed)
 Complex Care Management Care Guide Note  04/09/2023 Name: Shawn Murray MRN: [id number] DOB: 05/14/1985  Shawn Murray is a 38 y.o. year old male who is a primary care patient of Alvia Selinda PARAS, MD and is actively engaged with the care management team. I reached out to Shawn Murray by phone today to assist with re-scheduling  with the BSW.  Follow up plan: Unsuccessful telephone outreach attempt made. A HIPAA compliant phone message was left for the patient providing contact information and requesting a return call.  Thedford Franks, CCMA Care Coordination Care Guide Direct Dial: [phone]

## 2023-04-11 NOTE — Progress Notes (Signed)
 Complex Care Management Care Guide Note  04/11/2023 Name: SWEN KERNS MRN: [id number] DOB: 1986/02/29  LUE MAYEAUX is a 38 y.o. year old male who is a primary care patient of Ma Saupe, MD and is actively engaged with the care management team. I reached out to Jolee Naval by phone today to assist with re-scheduling  with the BSW.  Follow up plan: unsuccessful telephone outreach attempt made. A HIPAA compliant phone message was left for the patient providing contact information and requesting a return call. No additional outreach attempts will be made sue to inability to maintain patient contact.   Kandis Ormond, CMA, Care Guide [facility]  [facility], [facility] Guide Direct Dial: [phone]  Fax: [phone] Website: [email or web address]

## 2023-06-25 ENCOUNTER — Encounter: Payer: Self-pay | Admitting: Medical Oncology

## 2023-06-25 ENCOUNTER — Emergency Department

## 2023-06-25 ENCOUNTER — Emergency Department
Admission: EM | Admit: 2023-06-25 | Discharge: 2023-06-25 | Disposition: A | Discharge status: Home / Self Care | Attending: Emergency Medicine | Admitting: Emergency Medicine

## 2023-06-25 ENCOUNTER — Other Ambulatory Visit: Payer: Self-pay

## 2023-06-25 DIAGNOSIS — S161XXA Strain of muscle, fascia and tendon at neck level, initial encounter: Secondary | ICD-10-CM | POA: Insufficient documentation

## 2023-06-25 DIAGNOSIS — S20212A Contusion of left front wall of thorax, initial encounter: Secondary | ICD-10-CM | POA: Insufficient documentation

## 2023-06-25 DIAGNOSIS — M542 Cervicalgia: Secondary | ICD-10-CM | POA: Diagnosis present

## 2023-06-25 DIAGNOSIS — Y92410 Unspecified street and highway as the place of occurrence of the external cause: Secondary | ICD-10-CM | POA: Insufficient documentation

## 2023-06-25 DIAGNOSIS — T07XXXA Unspecified multiple injuries, initial encounter: Secondary | ICD-10-CM

## 2023-06-25 DIAGNOSIS — S40012A Contusion of left shoulder, initial encounter: Secondary | ICD-10-CM | POA: Insufficient documentation

## 2023-06-25 DIAGNOSIS — S301XXA Contusion of abdominal wall, initial encounter: Secondary | ICD-10-CM | POA: Insufficient documentation

## 2023-06-25 MED ORDER — IBUPROFEN 600 MG PO TABS: 600.0000 mg | ORAL_TABLET | Freq: Four times a day (QID) | ORAL | 0 refills | Status: DC | PRN
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
## 2023-06-25 NOTE — Discharge Instructions (Signed)
 Follow-up with your primary care provider if any continued problems.  You can expect to be sore and stiff for approximately 4 to 5 days.  A prescription for ibuprofen was sent to the pharmacy for you to take every 6 hours if needed.  Make sure that you eat with this medication as it could cause stomach upset.  You may use ice or heat to your muscles as needed for discomfort.
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
## 2023-09-01 ENCOUNTER — Encounter: Payer: Self-pay | Admitting: Family Medicine

## 2023-09-03 ENCOUNTER — Other Ambulatory Visit: Payer: Self-pay

## 2023-09-03 DIAGNOSIS — F419 Anxiety disorder, unspecified: Secondary | ICD-10-CM

## 2023-09-03 MED ORDER — TRAZODONE HCL 50 MG PO TABS
25.0000 mg | ORAL_TABLET | Freq: Every evening | ORAL | 0 refills | Status: AC | PRN
Start: 2023-09-03 — End: ?

## 2023-09-11 ENCOUNTER — Encounter: Payer: Self-pay | Admitting: Family Medicine

## 2023-09-11 ENCOUNTER — Telehealth (INDEPENDENT_AMBULATORY_CARE_PROVIDER_SITE_OTHER): Payer: Self-pay | Admitting: Family Medicine

## 2023-09-11 DIAGNOSIS — F32A Depression, unspecified: Secondary | ICD-10-CM

## 2023-09-11 DIAGNOSIS — F419 Anxiety disorder, unspecified: Secondary | ICD-10-CM

## 2023-09-11 DIAGNOSIS — G4700 Insomnia, unspecified: Secondary | ICD-10-CM | POA: Insufficient documentation

## 2023-09-11 MED ORDER — QUETIAPINE FUMARATE ER 50 MG PO TB24
50.0000 mg | ORAL_TABLET | Freq: Every day | ORAL | 1 refills | Status: AC
Start: 2023-09-11 — End: ?

## 2023-09-11 MED ORDER — HYDROXYZINE HCL 10 MG PO TABS
10.0000 mg | ORAL_TABLET | Freq: Three times a day (TID) | ORAL | 3 refills | Status: AC | PRN
Start: 2023-09-11 — End: ?

## 2023-09-11 MED ORDER — SERTRALINE HCL 100 MG PO TABS
ORAL_TABLET | ORAL | 3 refills | Status: AC
Start: 2023-09-11 — End: ?

## 2023-09-11 NOTE — Assessment & Plan Note (Signed)
 History of Present Illness Shawn Murray is a 38 year old male who presents with sleep disturbances and anxiety management issues.  He experiences significant sleep disturbances, including sleepwalking and acting out dreams. He has yelled in his sleep, pushed his wife out of bed, attempted to leave the house with car keys, and tried to cook at 3 AM. These behaviors have persisted despite taking only half a pill of trazodone , and he has recently run out of the medication, leading to poor sleep over the past two days.  He takes sertraline  (Zoloft ) daily for anxiety but feels it is not as effective as it used to be. He also uses hydroxyzine  as needed for anxiety, particularly when feeling overwhelmed or angry, and finds it helps to calm him down. He takes hydroxyzine  once daily in the morning. He is concerned that his current medications are not managing his symptoms as effectively as they did in the past, and he feels he is regressing to his previous state before treatment began.  Assessment and Plan  Anxiety Current treatment with sertraline  and hydroxyzine . Anxiety levels increasing. Seroquel considered due to Vraylar  cost concerns. - Continue sertraline  daily. - Continue hydroxyzine  as needed. - Prescribe quetiapine as an alternative to Vraylar .

## 2023-09-11 NOTE — Progress Notes (Signed)
 Primary Care / Sports Medicine Virtual Visit  Patient Information:  Patient ID: Shawn Murray, male DOB: [birth year] Age: 38 y.o. MRN: [id number]   Shawn Murray is a pleasant 38 y.o. male presenting with the following:  Chief Complaint  Patient presents with   Depression    Spoke with patient he has been taking his Zoloft  and hydroxyzine  as directed but he feels that it is not working like it is suppose to anymore. He is also picking at one spot of his skin when he is having anxiety.     Review of Systems: No fevers, chills, night sweats, weight loss, chest pain, or shortness of breath.   Patient Active Problem List   Diagnosis Date Noted   Insomnia 09/11/2023   Gastroesophageal reflux disease 09/18/2022   Rectal bleeding 09/18/2022   Episodic mood disorder (HCC) 08/03/2022   Cannabis use disorder, severe, dependence (HCC) 08/03/2022   High risk medication use 08/03/2022   Opioid use disorder, moderate, in sustained remission (HCC) 08/03/2022   Alcohol use disorder, mild, in sustained remission 08/03/2022   History of ADHD 08/03/2022   Annual physical exam 05/29/2022   Trigger finger 05/29/2022   Adjustment disorder 04/01/2017   Anxiety and depression 04/01/2017   Trauma and stressor-related disorder 04/01/2017   Suicidal ideation 04/01/2017   Past Medical History:  Diagnosis Date   ADHD    Alcohol use disorder, mild, in sustained remission    Anxiety    Arthritis    hands, wrists, fingers   Cannabis use disorder, severe, dependence (HCC)    Depression    Episodic mood disorder (HCC)    GERD (gastroesophageal reflux disease)    Heart murmur    History of pericarditis    Opioid use disorder, moderate, in sustained remission (HCC)    Trauma and stressor-related disorder    Outpatient Encounter Medications as of 09/11/2023  Medication Sig   QUEtiapine (SEROQUEL XR) 50 MG TB24 24 hr tablet Take 1 tablet (50 mg total) by mouth at bedtime. May increase weekly based on  response and tolerability to a maximum 150 mg once daily at bedtime.   [DISCONTINUED] hydrOXYzine  (ATARAX ) 10 MG tablet Take 1-5 tablets (10-50 mg total) by mouth 3 (three) times daily as needed for anxiety.   [DISCONTINUED] ibuprofen  (ADVIL ) 600 MG tablet Take 1 tablet (600 mg total) by mouth every 6 (six) hours as needed. With food   [DISCONTINUED] sertraline  (ZOLOFT ) 100 MG tablet Take 1 tablet PO QD   [DISCONTINUED] traZODone  (DESYREL ) 50 MG tablet Take 0.5-1 tablets (25-50 mg total) by mouth at bedtime as needed for sleep.   hydrOXYzine  (ATARAX ) 10 MG tablet Take 1-5 tablets (10-50 mg total) by mouth 3 (three) times daily as needed for anxiety.   sertraline  (ZOLOFT ) 100 MG tablet Take 1 tablet PO QD   [DISCONTINUED] cariprazine  (VRAYLAR ) 1.5 MG capsule Take 1 capsule (1.5 mg total) by mouth daily.   [DISCONTINUED] famotidine  (PEPCID ) 20 MG tablet Take 1 tablet (20 mg total) by mouth 2 (two) times daily.   [DISCONTINUED] naproxen  (NAPROSYN ) 500 MG tablet Take 1 tablet (500 mg total) by mouth 2 (two) times daily with a meal. (Patient not taking: Reported on 09/05/2022)   No facility-administered encounter medications on file as of 09/11/2023.   Past Surgical History:  Procedure Laterality Date   ADENOIDECTOMY     COLONOSCOPY WITH PROPOFOL  N/A 09/18/2022   Procedure: COLONOSCOPY WITH PROPOFOL ;  Surgeon: Marnee Sink, MD;  Location: [facility];  Service: Endoscopy;  Laterality: N/A;   ESOPHAGOGASTRODUODENOSCOPY (EGD) WITH PROPOFOL  N/A 09/18/2022   Procedure: ESOPHAGOGASTRODUODENOSCOPY (EGD) WITH PROPOFOL ;  Surgeon: Marnee Sink, MD;  Location: [facility];  Service: Endoscopy;  Laterality: N/A;   HERNIA REPAIR     TONSILLECTOMY      Virtual Visit via MyChart Video:   I connected with Shawn Murray on 09/11/23 via MyChart Video and verified that I am speaking with the correct person using appropriate identifiers.   The limitations, risks, security and privacy concerns of  performing an evaluation and management service by MyChart Video, including the higher likelihood of inaccurate diagnoses and treatments, and the availability of in person appointments were reviewed. The possible need of an additional face-to-face encounter for complete and high quality delivery of care was discussed. The patient was also made aware that there may be a patient responsible charge related to this service. The patient expressed understanding and wishes to proceed.  Provider location is in medical facility. Patient location is in their vehicle, different from provider location. People involved in care of the patient during this telehealth encounter were myself, my nurse/medical assistant, and my front office/scheduling team member.  Objective findings:   General: Speaking full sentences, no audible heavy breathing. Sounds alert and appropriately interactive. Well-appearing. Face symmetric. Extraocular movements intact. Pupils equal and round. No nasal flaring or accessory muscle use visualized.  Independent interpretation of notes and tests performed by another provider:   None  Pertinent History, Exam, Impression, and Recommendations:   Problem List Items Addressed This Visit     Anxiety and depression   History of Present Illness Shawn Murray is a 38 year old male who presents with sleep disturbances and anxiety management issues.  He experiences significant sleep disturbances, including sleepwalking and acting out dreams. He has yelled in his sleep, pushed his wife out of bed, attempted to leave the house with car keys, and tried to cook at 3 AM. These behaviors have persisted despite taking only half a pill of trazodone , and he has recently run out of the medication, leading to poor sleep over the past two days.  He takes sertraline  (Zoloft ) daily for anxiety but feels it is not as effective as it used to be. He also uses hydroxyzine  as needed for anxiety, particularly when  feeling overwhelmed or angry, and finds it helps to calm him down. He takes hydroxyzine  once daily in the morning. He is concerned that his current medications are not managing his symptoms as effectively as they did in the past, and he feels he is regressing to his previous state before treatment began.  Assessment and Plan  Anxiety Current treatment with sertraline  and hydroxyzine . Anxiety levels increasing. Seroquel considered due to Vraylar  cost concerns. - Continue sertraline  daily. - Continue hydroxyzine  as needed. - Prescribe quetiapine as an alternative to Vraylar .      Relevant Medications   sertraline  (ZOLOFT ) 100 MG tablet   hydrOXYzine  (ATARAX ) 10 MG tablet   Other Relevant Orders   AMB Referral [facility]BCI Care Management   Insomnia - Primary   Insomnia Sleep study planned for possible sleep apnea evaluation, patient will wait for insurance coverage prior to proceeding with sleep medicine referral. - Discontinue trazodone  immediately. - Refer to care coordination for insurance assistance. - Plan sleep study once insurance is obtained. - Prescribe quetiapine 50 mg at bedtime for sleep. Increase weekly if needed, up to three capsules.      Relevant Medications   QUEtiapine (SEROQUEL  XR) 50 MG TB24 24 hr tablet   Other Relevant Orders   AMB Referral [facility]BCI Care Management     Orders & Medications Medications:  Meds ordered this encounter  Medications   QUEtiapine (SEROQUEL XR) 50 MG TB24 24 hr tablet    Sig: Take 1 tablet (50 mg total) by mouth at bedtime. May increase weekly based on response and tolerability to a maximum 150 mg once daily at bedtime.    Dispense:  90 tablet    Refill:  1   sertraline  (ZOLOFT ) 100 MG tablet    Sig: Take 1 tablet PO QD    Dispense:  90 tablet    Refill:  3   hydrOXYzine  (ATARAX ) 10 MG tablet    Sig: Take 1-5 tablets (10-50 mg total) by mouth 3 (three) times daily as needed for anxiety.    Dispense:  90 tablet    Refill:  3   Orders  Placed This Encounter  Procedures   AMB Referral VBCI Care Management     I discussed the above assessment and treatment plan with the patient. The patient was provided an opportunity to ask questions and all were answered. The patient agreed with the plan and demonstrated an understanding of the instructions.   The patient was advised to call back or seek an in-person evaluation if the symptoms worsen or if the condition fails to improve as anticipated.   I provided a total time of 30 minutes including both face-to-face and non-face-to-face time on 09/11/2023 inclusive of time utilized for medical chart review, information gathering, care coordination with staff, and documentation completion.    Ma Saupe, MD, [facility]   Primary Care Sports Medicine Primary Care and Sports Medicine at [facility]

## 2023-09-11 NOTE — Patient Instructions (Signed)
 Patient Action Plan  1. Anxiety Management:    - Continue taking sertraline  (Zoloft ) 100 mg daily.    - Use hydroxyzine  (Atarax ) 10 mg as needed for anxiety, especially when feeling overwhelmed or angry.    - Start taking quetiapine (Seroquel XR) 50 mg at bedtime as prescribed. If needed, you can increase the dosage weekly, up to a maximum of three capsules per night.  2. Sleep Management:    - Stop taking trazodone  immediately.    - Work with care coordination to get insurance.    - Plan to undergo a sleep study for possible sleep apnea evaluation once insurance coverage is confirmed.  Red Flags: - If you experience any new or worsening symptoms of anxiety or sleep disturbances, contact your healthcare provider. - Monitor for any side effects from the new medication (quetiapine) and report them to your healthcare provider promptly.

## 2023-09-11 NOTE — Assessment & Plan Note (Signed)
 Insomnia Sleep study planned for possible sleep apnea evaluation, patient will wait for insurance coverage prior to proceeding with sleep medicine referral. - Discontinue trazodone  immediately. - Refer to care coordination for insurance assistance. - Plan sleep study once insurance is obtained. - Prescribe quetiapine 50 mg at bedtime for sleep. Increase weekly if needed, up to three capsules.

## 2023-09-12 ENCOUNTER — Telehealth: Payer: Self-pay

## 2023-09-12 NOTE — Telephone Encounter (Signed)
 Please review. Appt?  KP

## 2023-09-12 NOTE — Progress Notes (Signed)
 Complex Care Management Note Care Guide Note  09/12/2023 Name: Shawn Murray MRN: [id number] DOB: [birth year]   Complex Care Management Outreach Attempts: An unsuccessful telephone outreach was attempted today to offer the patient information about available complex care management services.  Follow Up Plan:  Additional outreach attempts will be made to offer the patient complex care management information and services.   Encounter Outcome:  No Answer  Lenton Rail , RMA     [place]  [facility], [facility] Guide  Direct Dial: [phone]  Website: [email or web address]

## 2023-09-17 NOTE — Progress Notes (Signed)
 Complex Care Management Note Care Guide Note  09/17/2023 Name: WASIL WOLKE MRN: [id number] DOB: [birth year]   Complex Care Management Outreach Attempts: A second unsuccessful outreach was attempted today to offer the patient with information about available complex care management services.  Follow Up Plan:  Additional outreach attempts will be made to offer the patient complex care management information and services.   Encounter Outcome:  No Answer  Jeoffrey Buffalo , RMA     [place]  [facility], [facility] Guide  Direct Dial: [phone]  Website: [email or web address]

## 2023-09-21 NOTE — Progress Notes (Signed)
 Complex Care Management Note Care Guide Note  09/21/2023 Name: Shawn Murray MRN: [id number] DOB: 1985/08/02   Complex Care Management Outreach Attempts: A third unsuccessful outreach was attempted today to offer the patient with information about available complex care management services.  Follow Up Plan:  No further outreach attempts will be made at this time. We have been unable to contact the patient to offer or enroll patient in complex care management services.  Encounter Outcome:  No Answer  Jeoffrey Buffalo , RMA     [place]  [facility], [facility] Guide  Direct Dial: [phone]  Website: [email or web address]

## 2024-01-10 ENCOUNTER — Encounter: Payer: Self-pay | Admitting: Family Medicine
